# Patient Record
Sex: Male | Born: 1945 | Race: White | Hispanic: No | Smoking: Former smoker
Health system: Southern US, Community
[De-identification: ages and names within clinical notes are randomized; demographics above are authoritative.]

## PROBLEM LIST (undated history)

## (undated) DIAGNOSIS — I10 Essential (primary) hypertension: Secondary | ICD-10-CM

## (undated) DIAGNOSIS — E785 Hyperlipidemia, unspecified: Secondary | ICD-10-CM

## (undated) HISTORY — DX: Essential (primary) hypertension: I10

## (undated) HISTORY — DX: Hyperlipidemia, unspecified: E78.5

## (undated) HISTORY — PX: VASECTOMY: SHX75

---

## 2005-07-26 ENCOUNTER — Ambulatory Visit: Payer: Self-pay | Admitting: Internal Medicine

## 2010-04-11 ENCOUNTER — Ambulatory Visit: Payer: Self-pay | Admitting: Gastroenterology

## 2013-03-20 HISTORY — PX: OTHER SURGICAL HISTORY: SHX169

## 2013-10-18 HISTORY — PX: CATARACT EXTRACTION, BILATERAL: SHX1313

## 2013-10-22 ENCOUNTER — Ambulatory Visit: Payer: Self-pay | Admitting: Ophthalmology

## 2015-03-24 DIAGNOSIS — E782 Mixed hyperlipidemia: Secondary | ICD-10-CM | POA: Diagnosis not present

## 2015-03-24 DIAGNOSIS — Z0001 Encounter for general adult medical examination with abnormal findings: Secondary | ICD-10-CM | POA: Diagnosis not present

## 2015-03-24 DIAGNOSIS — Z125 Encounter for screening for malignant neoplasm of prostate: Secondary | ICD-10-CM | POA: Diagnosis not present

## 2015-04-01 DIAGNOSIS — Z23 Encounter for immunization: Secondary | ICD-10-CM | POA: Diagnosis not present

## 2015-04-01 DIAGNOSIS — E782 Mixed hyperlipidemia: Secondary | ICD-10-CM | POA: Diagnosis not present

## 2015-04-01 DIAGNOSIS — I10 Essential (primary) hypertension: Secondary | ICD-10-CM | POA: Diagnosis not present

## 2015-04-01 DIAGNOSIS — N501 Vascular disorders of male genital organs: Secondary | ICD-10-CM | POA: Diagnosis not present

## 2015-04-28 DIAGNOSIS — Z0120 Encounter for dental examination and cleaning without abnormal findings: Secondary | ICD-10-CM | POA: Diagnosis not present

## 2015-06-14 DIAGNOSIS — H2512 Age-related nuclear cataract, left eye: Secondary | ICD-10-CM | POA: Diagnosis not present

## 2015-09-30 DIAGNOSIS — Z125 Encounter for screening for malignant neoplasm of prostate: Secondary | ICD-10-CM | POA: Diagnosis not present

## 2015-09-30 DIAGNOSIS — Z0001 Encounter for general adult medical examination with abnormal findings: Secondary | ICD-10-CM | POA: Diagnosis not present

## 2015-09-30 DIAGNOSIS — N529 Male erectile dysfunction, unspecified: Secondary | ICD-10-CM | POA: Diagnosis not present

## 2015-09-30 DIAGNOSIS — I10 Essential (primary) hypertension: Secondary | ICD-10-CM | POA: Diagnosis not present

## 2015-09-30 DIAGNOSIS — E782 Mixed hyperlipidemia: Secondary | ICD-10-CM | POA: Diagnosis not present

## 2015-12-30 DIAGNOSIS — Z23 Encounter for immunization: Secondary | ICD-10-CM | POA: Diagnosis not present

## 2016-03-27 DIAGNOSIS — Z125 Encounter for screening for malignant neoplasm of prostate: Secondary | ICD-10-CM | POA: Diagnosis not present

## 2016-03-27 DIAGNOSIS — I10 Essential (primary) hypertension: Secondary | ICD-10-CM | POA: Diagnosis not present

## 2016-03-27 DIAGNOSIS — Z0001 Encounter for general adult medical examination with abnormal findings: Secondary | ICD-10-CM | POA: Diagnosis not present

## 2016-03-30 DIAGNOSIS — E782 Mixed hyperlipidemia: Secondary | ICD-10-CM | POA: Diagnosis not present

## 2016-03-30 DIAGNOSIS — J309 Allergic rhinitis, unspecified: Secondary | ICD-10-CM | POA: Diagnosis not present

## 2016-03-30 DIAGNOSIS — I10 Essential (primary) hypertension: Secondary | ICD-10-CM | POA: Diagnosis not present

## 2016-07-06 DIAGNOSIS — H2512 Age-related nuclear cataract, left eye: Secondary | ICD-10-CM | POA: Diagnosis not present

## 2016-10-05 DIAGNOSIS — E782 Mixed hyperlipidemia: Secondary | ICD-10-CM | POA: Diagnosis not present

## 2016-10-05 DIAGNOSIS — N529 Male erectile dysfunction, unspecified: Secondary | ICD-10-CM | POA: Diagnosis not present

## 2016-10-05 DIAGNOSIS — I10 Essential (primary) hypertension: Secondary | ICD-10-CM | POA: Diagnosis not present

## 2016-10-05 DIAGNOSIS — Z0001 Encounter for general adult medical examination with abnormal findings: Secondary | ICD-10-CM | POA: Diagnosis not present

## 2016-12-25 DIAGNOSIS — R69 Illness, unspecified: Secondary | ICD-10-CM | POA: Diagnosis not present

## 2016-12-28 ENCOUNTER — Other Ambulatory Visit: Payer: Self-pay | Admitting: Nurse Practitioner

## 2016-12-28 ENCOUNTER — Ambulatory Visit
Admission: RE | Admit: 2016-12-28 | Discharge: 2016-12-28 | Disposition: A | Discharge status: Home / Self Care | Payer: Medicare HMO | Source: Ambulatory Visit | Attending: Nurse Practitioner | Admitting: Nurse Practitioner

## 2016-12-28 DIAGNOSIS — M542 Cervicalgia: Secondary | ICD-10-CM | POA: Insufficient documentation

## 2016-12-28 DIAGNOSIS — R52 Pain, unspecified: Secondary | ICD-10-CM

## 2016-12-28 DIAGNOSIS — M5030 Other cervical disc degeneration, unspecified cervical region: Secondary | ICD-10-CM | POA: Insufficient documentation

## 2016-12-28 DIAGNOSIS — E782 Mixed hyperlipidemia: Secondary | ICD-10-CM | POA: Diagnosis not present

## 2016-12-28 DIAGNOSIS — I10 Essential (primary) hypertension: Secondary | ICD-10-CM | POA: Diagnosis not present

## 2016-12-28 DIAGNOSIS — M4802 Spinal stenosis, cervical region: Secondary | ICD-10-CM | POA: Diagnosis not present

## 2017-01-25 DIAGNOSIS — M542 Cervicalgia: Secondary | ICD-10-CM | POA: Diagnosis not present

## 2017-02-05 DIAGNOSIS — N390 Urinary tract infection, site not specified: Secondary | ICD-10-CM | POA: Diagnosis not present

## 2017-02-05 DIAGNOSIS — R300 Dysuria: Secondary | ICD-10-CM | POA: Diagnosis not present

## 2017-02-21 DIAGNOSIS — N390 Urinary tract infection, site not specified: Secondary | ICD-10-CM | POA: Diagnosis not present

## 2017-02-21 DIAGNOSIS — I10 Essential (primary) hypertension: Secondary | ICD-10-CM | POA: Diagnosis not present

## 2017-04-20 ENCOUNTER — Encounter: Payer: Self-pay | Admitting: Nurse Practitioner

## 2017-04-20 ENCOUNTER — Ambulatory Visit: Payer: Medicare HMO | Admitting: Nurse Practitioner

## 2017-04-20 VITALS — BP 138/80 | HR 54 | Resp 16 | Ht 72.0 in | Wt 173.0 lb

## 2017-04-20 DIAGNOSIS — I10 Essential (primary) hypertension: Secondary | ICD-10-CM | POA: Diagnosis not present

## 2017-04-20 DIAGNOSIS — E782 Mixed hyperlipidemia: Secondary | ICD-10-CM | POA: Diagnosis not present

## 2017-04-20 DIAGNOSIS — M542 Cervicalgia: Secondary | ICD-10-CM

## 2017-04-20 NOTE — Progress Notes (Addendum)
[facility] [address] [place], [place] [address]  Internal MEDICINE  Office Visit Note  Patient Name: Michael Everett  [id number]  [id number]  Date of Service: 04/29/2017  Chief Complaint  Patient presents with  . Neck Pain    The patient is here for routine follow up exam. Complaining of neck pain. Pain worse when bending his head in forward motion. Able to turn and bend head to right and left without difficulty. started celebrex in October. Has helped a ltttle. X-ray of the neck did show degenerative disc disease and bilateral neuroforaminal narrowing in multiple levels.    Neck Pain   This is a chronic problem. The current episode started more than 1 month ago. The problem occurs every several days. The problem has been unchanged. The pain is associated with nothing. The pain is present in the right side. The quality of the pain is described as aching. The pain is moderate. Exacerbated by: bending the head forward.  Stiffness is present all day. Pertinent negatives include no chest pain, headaches or numbness. He has tried neck support and NSAIDs for the symptoms. The treatment provided mild relief.    Pt is here for routine follow up.    Current Medication: Outpatient Encounter Medications as of 04/20/2017  Medication Sig  . aspirin EC 81 MG tablet Take 81 mg by mouth daily.  . sildenafil (REVATIO) 20 MG tablet Take 2-3 tabs po 1 hr prior  To desired sexual activity  . atorvastatin (LIPITOR) 10 MG tablet TAKE 1 TABLET BY MOUTH AT BEDTIME FOR CHOLESTEROL  . celecoxib (CELEBREX) 200 MG capsule TAKE 1 CAPSULE BY MOUTH EVERY DAY FOR INFLAMMATION FOR 10 DAYS THEN AS NEEDED  . fluticasone (FLONASE) 50 MCG/ACT nasal spray USE 1 TO 2 SPRAYS IN BOTH NOSTRILS DAILY.  Marland Kitchen lisinopril (PRINIVIL,ZESTRIL) 40 MG tablet TAKE 1 TABLET BY MOUTH EVERY DAY FOR BLOOD PRESSURE CONTROL   No facility-administered encounter medications on file as of 04/20/2017.     Surgical History: Past  Surgical History:  Procedure Laterality Date  . cataract and lens implant right eye  2015  . CATARACT EXTRACTION, BILATERAL Bilateral 10/2013  . VASECTOMY      Medical History: Past Medical History:  Diagnosis Date  . Hyperlipidemia   . Hypertension     Family History: Family History  Problem Relation Age of Onset  . Hyperlipidemia Mother   . Heart disease Father     Social History   Socioeconomic History  . Marital status: Married    Spouse name: Not on file  . Number of children: Not on file  . Years of education: Not on file  . Highest education level: Not on file  Social Needs  . Financial resource strain: Not on file  . Food insecurity - worry: Not on file  . Food insecurity - inability: Not on file  . Transportation needs - medical: Not on file  . Transportation needs - non-medical: Not on file  Occupational History  . Not on file  Tobacco Use  . Smoking status: Never Smoker  . Smokeless tobacco: Never Used  Substance and Sexual Activity  . Alcohol use: No    Frequency: Never  . Drug use: No  . Sexual activity: Not on file  Other Topics Concern  . Not on file  Social History Narrative  . Not on file      Review of Systems  Constitutional: Negative for activity change, chills, fatigue and unexpected weight change.  HENT: Negative for congestion, postnasal drip, rhinorrhea, sneezing and sore throat.   Eyes: Negative for pain, discharge, redness, itching and visual disturbance.  Respiratory: Negative for cough, chest tightness, shortness of breath and wheezing.   Cardiovascular: Negative for chest pain and palpitations.  Gastrointestinal: Negative for abdominal pain, constipation, diarrhea, nausea and vomiting.  Genitourinary: Negative for dysuria and frequency.  Musculoskeletal: Positive for neck pain and neck stiffness. Negative for arthralgias, back pain and joint swelling.  Skin: Negative for rash.  Allergic/Immunologic: Negative for environmental  allergies, food allergies and immunocompromised state.  Neurological: Negative.  Negative for tremors, numbness and headaches.  Hematological: Negative for adenopathy. Does not bruise/bleed easily.  Psychiatric/Behavioral: Negative for sleep disturbance and suicidal ideas. Behavioral problem: Depression.    Today's Vitals   04/20/17 0903  BP: 138/80  Pulse: (!) 54  Resp: 16  SpO2: 98%  Weight: 173 lb (78.5 kg)  Height: 6' (1.829 m)    Physical Exam  Constitutional: He is oriented to person, place, and time. He appears well-developed and well-nourished. No distress.  HENT:  Head: Normocephalic and atraumatic.  Mouth/Throat: Oropharynx is clear and moist. No oropharyngeal exudate.  Eyes: EOM are normal. Pupils are equal, round, and reactive to light.  Neck: Neck supple. No JVD present. Spinous process tenderness and muscular tenderness present. Carotid bruit is not present. No tracheal deviation present. No thyromegaly present.    Cardiovascular: Normal rate, regular rhythm and normal heart sounds. Exam reveals no gallop and no friction rub.  No murmur heard. Pulmonary/Chest: Effort normal and breath sounds normal. No respiratory distress. He has no wheezes. He has no rales. He exhibits no tenderness.  Abdominal: Soft. Bowel sounds are normal. There is no tenderness.  Musculoskeletal: Normal range of motion.  Lymphadenopathy:    He has no cervical adenopathy.  Neurological: He is alert and oriented to person, place, and time. No cranial nerve deficit.  Skin: Skin is warm and dry. He is not diaphoretic.  Psychiatric: He has a normal mood and affect. His behavior is normal. Judgment and thought content normal.  Nursing note and vitals reviewed.   Assessment/Plan: 1. Cervicalgia Continue celebrex daily. Recommended physical therapy and written order given. If no improvement after PT, will get MRI.   2. Essential hypertension Stable. Continue bp medication as prescribed   3.  Mixed hyperlipidemia Cholesterol panel stable. contineu atorvastatin as prescribed   General Counseling: Frederick Peersrnest verbalizes understanding of the findings of todays visit and agrees with plan of treatment. I have discussed any further diagnostic evaluation that may be needed or ordered today. We also reviewed his medications today. he has been encouraged to call the office with any questions or concerns that should arise related to todays visit.  This patient was seen by Vincent GrosHeather Onetta Spainhower, FNP- C in Collaboration with Dr Lyndon CodeFozia M Khan as a part of collaborative care agreement   Time spent: 15 Minutes     Dr Lyndon CodeFozia M Khan Internal medicine

## 2017-04-29 ENCOUNTER — Encounter: Payer: Self-pay | Admitting: Nurse Practitioner

## 2017-04-29 DIAGNOSIS — M542 Cervicalgia: Secondary | ICD-10-CM | POA: Insufficient documentation

## 2017-04-29 DIAGNOSIS — E785 Hyperlipidemia, unspecified: Secondary | ICD-10-CM | POA: Insufficient documentation

## 2017-04-29 DIAGNOSIS — I10 Essential (primary) hypertension: Secondary | ICD-10-CM | POA: Insufficient documentation

## 2017-05-02 DIAGNOSIS — M546 Pain in thoracic spine: Secondary | ICD-10-CM | POA: Diagnosis not present

## 2017-05-02 DIAGNOSIS — M542 Cervicalgia: Secondary | ICD-10-CM | POA: Diagnosis not present

## 2017-05-02 DIAGNOSIS — M25511 Pain in right shoulder: Secondary | ICD-10-CM | POA: Diagnosis not present

## 2017-05-02 DIAGNOSIS — M6281 Muscle weakness (generalized): Secondary | ICD-10-CM | POA: Diagnosis not present

## 2017-05-07 DIAGNOSIS — M546 Pain in thoracic spine: Secondary | ICD-10-CM | POA: Diagnosis not present

## 2017-05-07 DIAGNOSIS — M542 Cervicalgia: Secondary | ICD-10-CM | POA: Diagnosis not present

## 2017-05-07 DIAGNOSIS — M6281 Muscle weakness (generalized): Secondary | ICD-10-CM | POA: Diagnosis not present

## 2017-05-07 DIAGNOSIS — M25511 Pain in right shoulder: Secondary | ICD-10-CM | POA: Diagnosis not present

## 2017-05-09 DIAGNOSIS — M546 Pain in thoracic spine: Secondary | ICD-10-CM | POA: Diagnosis not present

## 2017-05-09 DIAGNOSIS — M6281 Muscle weakness (generalized): Secondary | ICD-10-CM | POA: Diagnosis not present

## 2017-05-09 DIAGNOSIS — M542 Cervicalgia: Secondary | ICD-10-CM | POA: Diagnosis not present

## 2017-05-09 DIAGNOSIS — M25511 Pain in right shoulder: Secondary | ICD-10-CM | POA: Diagnosis not present

## 2017-05-14 DIAGNOSIS — M542 Cervicalgia: Secondary | ICD-10-CM | POA: Diagnosis not present

## 2017-05-14 DIAGNOSIS — M6281 Muscle weakness (generalized): Secondary | ICD-10-CM | POA: Diagnosis not present

## 2017-05-14 DIAGNOSIS — M546 Pain in thoracic spine: Secondary | ICD-10-CM | POA: Diagnosis not present

## 2017-05-14 DIAGNOSIS — M25511 Pain in right shoulder: Secondary | ICD-10-CM | POA: Diagnosis not present

## 2017-05-16 DIAGNOSIS — M6281 Muscle weakness (generalized): Secondary | ICD-10-CM | POA: Diagnosis not present

## 2017-05-16 DIAGNOSIS — M25511 Pain in right shoulder: Secondary | ICD-10-CM | POA: Diagnosis not present

## 2017-05-16 DIAGNOSIS — M542 Cervicalgia: Secondary | ICD-10-CM | POA: Diagnosis not present

## 2017-05-16 DIAGNOSIS — M546 Pain in thoracic spine: Secondary | ICD-10-CM | POA: Diagnosis not present

## 2017-05-21 DIAGNOSIS — M6281 Muscle weakness (generalized): Secondary | ICD-10-CM | POA: Diagnosis not present

## 2017-05-21 DIAGNOSIS — M25511 Pain in right shoulder: Secondary | ICD-10-CM | POA: Diagnosis not present

## 2017-05-21 DIAGNOSIS — M542 Cervicalgia: Secondary | ICD-10-CM | POA: Diagnosis not present

## 2017-05-21 DIAGNOSIS — M546 Pain in thoracic spine: Secondary | ICD-10-CM | POA: Diagnosis not present

## 2017-05-23 DIAGNOSIS — M25511 Pain in right shoulder: Secondary | ICD-10-CM | POA: Diagnosis not present

## 2017-05-23 DIAGNOSIS — M6281 Muscle weakness (generalized): Secondary | ICD-10-CM | POA: Diagnosis not present

## 2017-05-23 DIAGNOSIS — M542 Cervicalgia: Secondary | ICD-10-CM | POA: Diagnosis not present

## 2017-05-23 DIAGNOSIS — M546 Pain in thoracic spine: Secondary | ICD-10-CM | POA: Diagnosis not present

## 2017-05-24 ENCOUNTER — Other Ambulatory Visit: Payer: Self-pay

## 2017-05-24 MED ORDER — FLUTICASONE PROPIONATE 50 MCG/ACT NA SUSP: 1.0000 | Freq: Every day | NASAL | 3 refills | Status: DC

## 2017-05-28 DIAGNOSIS — M546 Pain in thoracic spine: Secondary | ICD-10-CM | POA: Diagnosis not present

## 2017-05-28 DIAGNOSIS — M6281 Muscle weakness (generalized): Secondary | ICD-10-CM | POA: Diagnosis not present

## 2017-05-28 DIAGNOSIS — M542 Cervicalgia: Secondary | ICD-10-CM | POA: Diagnosis not present

## 2017-05-28 DIAGNOSIS — M25511 Pain in right shoulder: Secondary | ICD-10-CM | POA: Diagnosis not present

## 2017-05-31 DIAGNOSIS — M542 Cervicalgia: Secondary | ICD-10-CM | POA: Diagnosis not present

## 2017-05-31 DIAGNOSIS — M25511 Pain in right shoulder: Secondary | ICD-10-CM | POA: Diagnosis not present

## 2017-05-31 DIAGNOSIS — M6281 Muscle weakness (generalized): Secondary | ICD-10-CM | POA: Diagnosis not present

## 2017-05-31 DIAGNOSIS — M546 Pain in thoracic spine: Secondary | ICD-10-CM | POA: Diagnosis not present

## 2017-06-04 DIAGNOSIS — M542 Cervicalgia: Secondary | ICD-10-CM | POA: Diagnosis not present

## 2017-06-04 DIAGNOSIS — M25511 Pain in right shoulder: Secondary | ICD-10-CM | POA: Diagnosis not present

## 2017-06-04 DIAGNOSIS — M546 Pain in thoracic spine: Secondary | ICD-10-CM | POA: Diagnosis not present

## 2017-06-04 DIAGNOSIS — M6281 Muscle weakness (generalized): Secondary | ICD-10-CM | POA: Diagnosis not present

## 2017-06-06 ENCOUNTER — Other Ambulatory Visit: Payer: Self-pay

## 2017-06-06 MED ORDER — CELECOXIB 200 MG PO CAPS: ORAL_CAPSULE | ORAL | 3 refills | Status: DC

## 2017-06-07 ENCOUNTER — Ambulatory Visit: Payer: Self-pay | Admitting: Nurse Practitioner

## 2017-06-07 DIAGNOSIS — M542 Cervicalgia: Secondary | ICD-10-CM | POA: Diagnosis not present

## 2017-06-07 DIAGNOSIS — M6281 Muscle weakness (generalized): Secondary | ICD-10-CM | POA: Diagnosis not present

## 2017-06-07 DIAGNOSIS — M546 Pain in thoracic spine: Secondary | ICD-10-CM | POA: Diagnosis not present

## 2017-06-07 DIAGNOSIS — M25511 Pain in right shoulder: Secondary | ICD-10-CM | POA: Diagnosis not present

## 2017-06-11 DIAGNOSIS — M6281 Muscle weakness (generalized): Secondary | ICD-10-CM | POA: Diagnosis not present

## 2017-06-11 DIAGNOSIS — M546 Pain in thoracic spine: Secondary | ICD-10-CM | POA: Diagnosis not present

## 2017-06-11 DIAGNOSIS — M542 Cervicalgia: Secondary | ICD-10-CM | POA: Diagnosis not present

## 2017-06-11 DIAGNOSIS — M25511 Pain in right shoulder: Secondary | ICD-10-CM | POA: Diagnosis not present

## 2017-06-14 DIAGNOSIS — M546 Pain in thoracic spine: Secondary | ICD-10-CM | POA: Diagnosis not present

## 2017-06-14 DIAGNOSIS — M25511 Pain in right shoulder: Secondary | ICD-10-CM | POA: Diagnosis not present

## 2017-06-14 DIAGNOSIS — M6281 Muscle weakness (generalized): Secondary | ICD-10-CM | POA: Diagnosis not present

## 2017-06-14 DIAGNOSIS — M542 Cervicalgia: Secondary | ICD-10-CM | POA: Diagnosis not present

## 2017-06-18 DIAGNOSIS — M6281 Muscle weakness (generalized): Secondary | ICD-10-CM | POA: Diagnosis not present

## 2017-06-18 DIAGNOSIS — M546 Pain in thoracic spine: Secondary | ICD-10-CM | POA: Diagnosis not present

## 2017-06-18 DIAGNOSIS — M542 Cervicalgia: Secondary | ICD-10-CM | POA: Diagnosis not present

## 2017-06-18 DIAGNOSIS — M25511 Pain in right shoulder: Secondary | ICD-10-CM | POA: Diagnosis not present

## 2017-06-21 DIAGNOSIS — M546 Pain in thoracic spine: Secondary | ICD-10-CM | POA: Diagnosis not present

## 2017-06-21 DIAGNOSIS — M542 Cervicalgia: Secondary | ICD-10-CM | POA: Diagnosis not present

## 2017-06-21 DIAGNOSIS — M25511 Pain in right shoulder: Secondary | ICD-10-CM | POA: Diagnosis not present

## 2017-06-21 DIAGNOSIS — M6281 Muscle weakness (generalized): Secondary | ICD-10-CM | POA: Diagnosis not present

## 2017-06-26 DIAGNOSIS — M25511 Pain in right shoulder: Secondary | ICD-10-CM | POA: Diagnosis not present

## 2017-06-26 DIAGNOSIS — M542 Cervicalgia: Secondary | ICD-10-CM | POA: Diagnosis not present

## 2017-06-26 DIAGNOSIS — M546 Pain in thoracic spine: Secondary | ICD-10-CM | POA: Diagnosis not present

## 2017-06-26 DIAGNOSIS — M6281 Muscle weakness (generalized): Secondary | ICD-10-CM | POA: Diagnosis not present

## 2017-06-28 ENCOUNTER — Encounter: Payer: Self-pay | Admitting: Nurse Practitioner

## 2017-06-28 ENCOUNTER — Ambulatory Visit: Payer: Medicare HMO | Admitting: Nurse Practitioner

## 2017-06-28 VITALS — BP 132/82 | HR 67 | Resp 16 | Ht 72.0 in | Wt 170.8 lb

## 2017-06-28 DIAGNOSIS — E782 Mixed hyperlipidemia: Secondary | ICD-10-CM | POA: Diagnosis not present

## 2017-06-28 DIAGNOSIS — I10 Essential (primary) hypertension: Secondary | ICD-10-CM | POA: Diagnosis not present

## 2017-06-28 DIAGNOSIS — M546 Pain in thoracic spine: Secondary | ICD-10-CM | POA: Diagnosis not present

## 2017-06-28 DIAGNOSIS — M542 Cervicalgia: Secondary | ICD-10-CM | POA: Diagnosis not present

## 2017-06-28 DIAGNOSIS — M25511 Pain in right shoulder: Secondary | ICD-10-CM | POA: Diagnosis not present

## 2017-06-28 DIAGNOSIS — M6281 Muscle weakness (generalized): Secondary | ICD-10-CM | POA: Diagnosis not present

## 2017-06-28 MED ORDER — ETODOLAC 400 MG PO TABS: 400.0000 mg | ORAL_TABLET | Freq: Two times a day (BID) | ORAL | 3 refills | Status: DC | PRN

## 2017-06-28 NOTE — Progress Notes (Signed)
[facility] [address] [place], [place] [address]  Internal MEDICINE  Office Visit Note  Patient Name: Michael Everett  [id number]  [id number]  Date of Service: 06/28/2017  Chief Complaint  Patient presents with  . Follow-up    The patient is here for routine follow up exam. Complaining of neck pain. Improved since his last visit. started physical therapy late in February. Is scheduled to continue this through May. He does feel like pain is improving. It is not present at all times. Now just hurting at the extent of movement toward the right.  Pain worse when bending his head in forward motion. Able to turn and bend head to right and left without difficulty. started celebrex in October. No longer taking celebrex. Did not see that it was helping that much    Pt is here for routine follow up.    Current Medication: Outpatient Encounter Medications as of 06/28/2017  Medication Sig  . aspirin EC 81 MG tablet Take 81 mg by mouth daily.  Marland Kitchen atorvastatin (LIPITOR) 10 MG tablet TAKE 1 TABLET BY MOUTH AT BEDTIME FOR CHOLESTEROL  . fluticasone (FLONASE) 50 MCG/ACT nasal spray Place 1 spray into both nostrils daily.  Marland Kitchen lisinopril (PRINIVIL,ZESTRIL) 40 MG tablet TAKE 1 TABLET BY MOUTH EVERY DAY FOR BLOOD PRESSURE CONTROL  . sildenafil (REVATIO) 20 MG tablet Take 2-3 tabs po 1 hr prior  To desired sexual activity  . etodolac (LODINE) 400 MG tablet Take 1 tablet (400 mg total) by mouth 2 (two) times daily as needed for moderate pain.  . [DISCONTINUED] celecoxib (CELEBREX) 200 MG capsule TAKE 1 CAPSULE BY MOUTH EVERY DAY FOR INFLAMMATION FOR 10 DAYS THEN AS NEEDED   No facility-administered encounter medications on file as of 06/28/2017.     Surgical History: Past Surgical History:  Procedure Laterality Date  . cataract and lens implant right eye  2015  . CATARACT EXTRACTION, BILATERAL Bilateral 10/2013  . VASECTOMY      Medical History: Past Medical History:  Diagnosis  Date  . Hyperlipidemia   . Hypertension     Family History: Family History  Problem Relation Age of Onset  . Hyperlipidemia Mother   . Heart disease Father     Social History   Socioeconomic History  . Marital status: Married    Spouse name: Not on file  . Number of children: Not on file  . Years of education: Not on file  . Highest education level: Not on file  Occupational History  . Not on file  Social Needs  . Financial resource strain: Not on file  . Food insecurity:    Worry: Not on file    Inability: Not on file  . Transportation needs:    Medical: Not on file    Non-medical: Not on file  Tobacco Use  . Smoking status: Never Smoker  . Smokeless tobacco: Never Used  Substance and Sexual Activity  . Alcohol use: No    Frequency: Never  . Drug use: No  . Sexual activity: Not on file  Lifestyle  . Physical activity:    Days per week: Not on file    Minutes per session: Not on file  . Stress: Not on file  Relationships  . Social connections:    Talks on phone: Not on file    Gets together: Not on file    Attends religious service: Not on file    Active member of club or organization: Not on file  Attends meetings of clubs or organizations: Not on file    Relationship status: Not on file  . Intimate partner violence:    Fear of current or ex partner: Not on file    Emotionally abused: Not on file    Physically abused: Not on file    Forced sexual activity: Not on file  Other Topics Concern  . Not on file  Social History Narrative  . Not on file      Review of Systems  Constitutional: Negative for activity change, chills, fatigue and unexpected weight change.  HENT: Negative for congestion, postnasal drip, rhinorrhea, sneezing and sore throat.   Eyes: Negative.  Negative for pain, discharge, redness, itching and visual disturbance.  Respiratory: Negative for cough, chest tightness, shortness of breath and wheezing.   Cardiovascular: Negative for  chest pain and palpitations.  Gastrointestinal: Negative for abdominal pain, constipation, diarrhea, nausea and vomiting.  Endocrine: Negative for cold intolerance, heat intolerance, polydipsia, polyphagia and polyuria.  Genitourinary: Negative for dysuria and frequency.  Musculoskeletal: Positive for neck pain and neck stiffness. Negative for arthralgias, back pain and joint swelling.       Improving with improved ROM in neck.   Skin: Negative for rash.  Allergic/Immunologic: Negative for environmental allergies, food allergies and immunocompromised state.  Neurological: Negative.  Negative for tremors, numbness and headaches.  Hematological: Negative for adenopathy. Does not bruise/bleed easily.  Psychiatric/Behavioral: Negative for sleep disturbance and suicidal ideas. Behavioral problem: Depression.   Today's Vitals   06/28/17 1424  BP: 132/82  Pulse: 67  Resp: 16  SpO2: 97%  Weight: 170 lb 12.8 oz (77.5 kg)  Height: 6' (1.829 m)    Physical Exam  Constitutional: He is oriented to person, place, and time. He appears well-developed and well-nourished. No distress.  HENT:  Head: Normocephalic and atraumatic.  Mouth/Throat: Oropharynx is clear and moist. No oropharyngeal exudate.  Eyes: Pupils are equal, round, and reactive to light. EOM are normal.  Neck: Neck supple. No JVD present. Spinous process tenderness and muscular tenderness present. Carotid bruit is not present. No tracheal deviation present. No thyromegaly present.    Cardiovascular: Normal rate, regular rhythm and normal heart sounds. Exam reveals no gallop and no friction rub.  No murmur heard. Pulmonary/Chest: Effort normal and breath sounds normal. No respiratory distress. He has no wheezes. He has no rales. He exhibits no tenderness.  Abdominal: Soft. Bowel sounds are normal. There is no tenderness.  Musculoskeletal: Normal range of motion.  Lymphadenopathy:    He has no cervical adenopathy.  Neurological: He  is alert and oriented to person, place, and time. No cranial nerve deficit.  Skin: Skin is warm and dry. He is not diaphoretic.  Psychiatric: He has a normal mood and affect. His behavior is normal. Judgment and thought content normal.  Nursing note and vitals reviewed.  Assessment/Plan: 1. Cervicalgia - etodolac (LODINE) 400 MG tablet; Take 1 tablet (400 mg total) by mouth 2 (two) times daily as needed for moderate pain.  Dispense: 60 tablet; Refill: 3 Patient will continue physical therapy. Sessions scheduled through Jul 26, 2017. Will get MRI if neck pain is persistent after completion of PT.   2. Essential hypertension Stable. Continue bp medication as prescribed.  3. Mixed hyperlipidemia Continue atorvastatin as prescribed.   General Counseling: Michael Peersrnest verbalizes understanding of the findings of todays visit and agrees with plan of treatment. I have discussed any further diagnostic evaluation that may be needed or ordered today. We also reviewed his  medications today. he has been encouraged to call the office with any questions or concerns that should arise related to todays visit.   This patient was seen by Vincent Gros, FNP- C in Collaboration with Dr Lyndon Code as a part of collaborative care agreement  Meds ordered this encounter  Medications  . etodolac (LODINE) 400 MG tablet    Sig: Take 1 tablet (400 mg total) by mouth 2 (two) times daily as needed for moderate pain.    Dispense:  60 tablet    Refill:  3    Order Specific Question:   Supervising Provider    Answer:   Lyndon Code [[id number]]    Time spent: 1 Minutes     Dr Lyndon Code Internal medicine

## 2017-07-03 DIAGNOSIS — M6281 Muscle weakness (generalized): Secondary | ICD-10-CM | POA: Diagnosis not present

## 2017-07-03 DIAGNOSIS — M542 Cervicalgia: Secondary | ICD-10-CM | POA: Diagnosis not present

## 2017-07-03 DIAGNOSIS — M546 Pain in thoracic spine: Secondary | ICD-10-CM | POA: Diagnosis not present

## 2017-07-03 DIAGNOSIS — M25511 Pain in right shoulder: Secondary | ICD-10-CM | POA: Diagnosis not present

## 2017-07-06 DIAGNOSIS — M546 Pain in thoracic spine: Secondary | ICD-10-CM | POA: Diagnosis not present

## 2017-07-06 DIAGNOSIS — M6281 Muscle weakness (generalized): Secondary | ICD-10-CM | POA: Diagnosis not present

## 2017-07-06 DIAGNOSIS — M25511 Pain in right shoulder: Secondary | ICD-10-CM | POA: Diagnosis not present

## 2017-07-06 DIAGNOSIS — M542 Cervicalgia: Secondary | ICD-10-CM | POA: Diagnosis not present

## 2017-07-10 DIAGNOSIS — M25511 Pain in right shoulder: Secondary | ICD-10-CM | POA: Diagnosis not present

## 2017-07-10 DIAGNOSIS — M6281 Muscle weakness (generalized): Secondary | ICD-10-CM | POA: Diagnosis not present

## 2017-07-10 DIAGNOSIS — M542 Cervicalgia: Secondary | ICD-10-CM | POA: Diagnosis not present

## 2017-07-10 DIAGNOSIS — M546 Pain in thoracic spine: Secondary | ICD-10-CM | POA: Diagnosis not present

## 2017-07-12 DIAGNOSIS — M6281 Muscle weakness (generalized): Secondary | ICD-10-CM | POA: Diagnosis not present

## 2017-07-12 DIAGNOSIS — M546 Pain in thoracic spine: Secondary | ICD-10-CM | POA: Diagnosis not present

## 2017-07-12 DIAGNOSIS — M25511 Pain in right shoulder: Secondary | ICD-10-CM | POA: Diagnosis not present

## 2017-07-12 DIAGNOSIS — M542 Cervicalgia: Secondary | ICD-10-CM | POA: Diagnosis not present

## 2017-07-19 DIAGNOSIS — M6281 Muscle weakness (generalized): Secondary | ICD-10-CM | POA: Diagnosis not present

## 2017-07-19 DIAGNOSIS — M546 Pain in thoracic spine: Secondary | ICD-10-CM | POA: Diagnosis not present

## 2017-07-19 DIAGNOSIS — M542 Cervicalgia: Secondary | ICD-10-CM | POA: Diagnosis not present

## 2017-07-19 DIAGNOSIS — M25511 Pain in right shoulder: Secondary | ICD-10-CM | POA: Diagnosis not present

## 2017-07-26 ENCOUNTER — Telehealth: Payer: Self-pay | Admitting: Internal Medicine

## 2017-07-26 DIAGNOSIS — M542 Cervicalgia: Secondary | ICD-10-CM | POA: Diagnosis not present

## 2017-07-26 DIAGNOSIS — M6281 Muscle weakness (generalized): Secondary | ICD-10-CM | POA: Diagnosis not present

## 2017-07-26 DIAGNOSIS — M546 Pain in thoracic spine: Secondary | ICD-10-CM | POA: Diagnosis not present

## 2017-07-26 DIAGNOSIS — M25511 Pain in right shoulder: Secondary | ICD-10-CM | POA: Diagnosis not present

## 2017-07-26 NOTE — Telephone Encounter (Signed)
Mailed PIVOT pt orders back to dept after dfk signed. Beth

## 2017-08-01 DIAGNOSIS — H35371 Puckering of macula, right eye: Secondary | ICD-10-CM | POA: Diagnosis not present

## 2017-08-08 DIAGNOSIS — M542 Cervicalgia: Secondary | ICD-10-CM | POA: Diagnosis not present

## 2017-08-08 DIAGNOSIS — M546 Pain in thoracic spine: Secondary | ICD-10-CM | POA: Diagnosis not present

## 2017-08-08 DIAGNOSIS — M25511 Pain in right shoulder: Secondary | ICD-10-CM | POA: Diagnosis not present

## 2017-08-08 DIAGNOSIS — M6281 Muscle weakness (generalized): Secondary | ICD-10-CM | POA: Diagnosis not present

## 2017-09-17 ENCOUNTER — Other Ambulatory Visit: Payer: Self-pay | Admitting: Nurse Practitioner

## 2017-09-17 ENCOUNTER — Ambulatory Visit: Payer: Medicare HMO | Admitting: Nurse Practitioner

## 2017-09-17 ENCOUNTER — Encounter: Payer: Self-pay | Admitting: Nurse Practitioner

## 2017-09-17 VITALS — BP 155/90 | HR 56 | Resp 16 | Ht 72.0 in | Wt 167.4 lb

## 2017-09-17 DIAGNOSIS — M542 Cervicalgia: Secondary | ICD-10-CM | POA: Diagnosis not present

## 2017-09-17 DIAGNOSIS — E782 Mixed hyperlipidemia: Secondary | ICD-10-CM

## 2017-09-17 DIAGNOSIS — Z125 Encounter for screening for malignant neoplasm of prostate: Secondary | ICD-10-CM | POA: Diagnosis not present

## 2017-09-17 DIAGNOSIS — Z23 Encounter for immunization: Secondary | ICD-10-CM

## 2017-09-17 DIAGNOSIS — R300 Dysuria: Secondary | ICD-10-CM

## 2017-09-17 DIAGNOSIS — Z0001 Encounter for general adult medical examination with abnormal findings: Secondary | ICD-10-CM | POA: Diagnosis not present

## 2017-09-17 DIAGNOSIS — I10 Essential (primary) hypertension: Secondary | ICD-10-CM

## 2017-09-17 NOTE — Progress Notes (Signed)
[facility] [address] [place], [place] [address]  Internal MEDICINE  Office Visit Note  Patient Name: Michael Everett  [id number]  [id number]  Date of Service: 09/17/2017   Pt is here for routine health maintenance examination   Chief Complaint  Patient presents with  . Annual Exam  . Neck Pain    mostly along right side of neck.      Continues  To c/o neck pain, mostly along the right side of the neck. Improved since his last visit. Completed 2 months of physical therapy.  He does feel like pain has improved some. Pain doesn't occur until he rotates head to the right side, but does happen every time he moves or turns his head to the right. Does not really hurt when turning his head to the left. X-ray of the cervical spine did show Multilevel bilateral neural foraminal encroachment likely impacting the cervical nerve roots.    Current Medication: Outpatient Encounter Medications as of 09/17/2017  Medication Sig  . aspirin EC 81 MG tablet Take 81 mg by mouth daily.  Marland Kitchen atorvastatin (LIPITOR) 10 MG tablet TAKE 1 TABLET BY MOUTH AT BEDTIME FOR CHOLESTEROL  . etodolac (LODINE) 400 MG tablet Take 1 tablet (400 mg total) by mouth 2 (two) times daily as needed for moderate pain.  . fluticasone (FLONASE) 50 MCG/ACT nasal spray Place 1 spray into both nostrils daily.  Marland Kitchen lisinopril (PRINIVIL,ZESTRIL) 40 MG tablet TAKE 1 TABLET BY MOUTH EVERY DAY FOR BLOOD PRESSURE CONTROL  . sildenafil (REVATIO) 20 MG tablet Take 2-3 tabs po 1 hr prior  To desired sexual activity   No facility-administered encounter medications on file as of 09/17/2017.     Surgical History: Past Surgical History:  Procedure Laterality Date  . cataract and lens implant right eye  2015  . CATARACT EXTRACTION, BILATERAL Bilateral 10/2013  . VASECTOMY      Medical History: Past Medical History:  Diagnosis Date  . Hyperlipidemia   . Hypertension     Family History: Family History  Problem  Relation Age of Onset  . Hyperlipidemia Mother   . Heart disease Father       Review of Systems  Constitutional: Negative for activity change, chills, fatigue and unexpected weight change.  HENT: Negative for congestion, postnasal drip, rhinorrhea, sneezing and sore throat.   Eyes: Negative.  Negative for pain, discharge, redness, itching and visual disturbance.  Respiratory: Negative for cough, chest tightness, shortness of breath and wheezing.   Cardiovascular: Negative for chest pain and palpitations.  Gastrointestinal: Negative for abdominal pain, constipation, diarrhea, nausea and vomiting.  Endocrine: Negative for cold intolerance, heat intolerance, polydipsia, polyphagia and polyuria.  Genitourinary: Negative for dysuria and frequency.  Musculoskeletal: Positive for neck pain and neck stiffness. Negative for arthralgias, back pain and joint swelling.       Persistent pain in right side of neck. Completed 2 to 3 months physical therapy and continues to have pain.   Skin: Negative for rash.  Allergic/Immunologic: Negative for environmental allergies, food allergies and immunocompromised state.  Neurological: Negative for dizziness, tremors, numbness and headaches.  Hematological: Negative for adenopathy. Does not bruise/bleed easily.  Psychiatric/Behavioral: Negative for sleep disturbance and suicidal ideas. Behavioral problem: Depression.    Today's Vitals   09/17/17 1045  BP: (!) 155/90  Pulse: (!) 56  Resp: 16  SpO2: 98%  Weight: 167 lb 6.4 oz (75.9 kg)  Height: 6' (1.829 m)     Physical Exam  Constitutional: He  is oriented to person, place, and time. He appears well-developed and well-nourished. No distress.  HENT:  Head: Normocephalic and atraumatic.  Mouth/Throat: Oropharynx is clear and moist. No oropharyngeal exudate.  Eyes: Pupils are equal, round, and reactive to light. EOM are normal.  Neck: Neck supple. No JVD present. Spinous process tenderness and muscular  tenderness present. Carotid bruit is not present. No tracheal deviation present. No thyromegaly present.    Cardiovascular: Normal rate, regular rhythm, normal heart sounds and intact distal pulses. Exam reveals no gallop and no friction rub.  No murmur heard. Pulmonary/Chest: Effort normal and breath sounds normal. No respiratory distress. He has no wheezes. He has no rales. He exhibits no tenderness.  Abdominal: Soft. Bowel sounds are normal. There is no tenderness.  Musculoskeletal: Normal range of motion.  Lymphadenopathy:    He has no cervical adenopathy.  Neurological: He is alert and oriented to person, place, and time. No cranial nerve deficit.  Skin: Skin is warm and dry. He is not diaphoretic.  Psychiatric: He has a normal mood and affect. His behavior is normal. Judgment and thought content normal.  Nursing note and vitals reviewed.  Assessment/Plan: 1. Encounter for general adult medical examination with abnormal findings Annual health maintenance exam today. Routine, fasting labs ordered.  - CBC with Differential/Platelet - Comprehensive metabolic panel - Lipid panel - TSH  2. Cervicalgia Unresolved after conservative treatmetn with NSAIDs and physical therapy. Refer to ortho/spine specialist for further evaluation and treatmetn.  - Ambulatory referral to Orthopedic Surgery  3. Essential hypertension Stable. Continue bp medication as prescribed  - CBC with Differential/Platelet - Comprehensive metabolic panel  4. Mixed hyperlipidemia Routine, fasting labs ordered. Adjust statin therapy as indicated.  - Lipid panel  5. Special screening for malignant neoplasm of prostate - PSA  6. Need for vaccination against Streptococcus pneumoniae using pneumococcal conjugate vaccine 13 - Pneumococcal conjugate vaccine 13-valent IM  7. Dysuria - UA/M w/rflx Culture, Routine  General Counseling: Michael Everett verbalizes understanding of the findings of todays visit and agrees with  plan of treatment. I have discussed any further diagnostic evaluation that may be needed or ordered today. We also reviewed his medications today. he has been encouraged to call the office with any questions or concerns that should arise related to todays visit.    Counseling:  This patient was seen by Vincent GrosHeather Karmela Bram, FNP- C in Collaboration with Dr Lyndon CodeFozia M Khan as a part of collaborative care agreement  Orders Placed This Encounter  Procedures  . Pneumococcal conjugate vaccine 13-valent IM  . UA/M w/rflx Culture, Routine  . PSA  . CBC with Differential/Platelet  . Comprehensive metabolic panel  . Lipid panel  . TSH  . Ambulatory referral to Orthopedic Surgery     Time spent: 2930 Minutes   Lyndon CodeFozia M Khan, MD  Internal Medicine

## 2017-09-18 LAB — SPECIMEN STATUS REPORT

## 2017-09-18 LAB — COMPREHENSIVE METABOLIC PANEL
ALK PHOS: 75 IU/L (ref 39–117)
ALT: 22 IU/L (ref 0–44)
AST: 18 IU/L (ref 0–40)
Albumin/Globulin Ratio: 1.7 (ref 1.2–2.2)
Albumin: 4.3 g/dL (ref 3.5–4.8)
BUN/Creatinine Ratio: 19 (ref 10–24)
BUN: 18 mg/dL (ref 8–27)
Bilirubin Total: 0.9 mg/dL (ref 0.0–1.2)
CHLORIDE: 103 mmol/L (ref 96–106)
CO2: 24 mmol/L (ref 20–29)
CREATININE: 0.96 mg/dL (ref 0.76–1.27)
Calcium: 9.4 mg/dL (ref 8.6–10.2)
GFR calc Af Amer: 90 mL/min/{1.73_m2} (ref >59)
GFR calc non Af Amer: 78 mL/min/{1.73_m2} (ref >59)
GLOBULIN, TOTAL: 2.6 g/dL (ref 1.5–4.5)
GLUCOSE: 96 mg/dL (ref 65–99)
POTASSIUM: 5 mmol/L (ref 3.5–5.2)
SODIUM: 142 mmol/L (ref 134–144)
Total Protein: 6.9 g/dL (ref 6.0–8.5)

## 2017-09-18 LAB — LIPID PANEL W/O CHOL/HDL RATIO
CHOLESTEROL TOTAL: 118 mg/dL (ref 100–199)
HDL: 48 mg/dL (ref >39)
LDL Calculated: 60 mg/dL (ref 0–99)
Triglycerides: 51 mg/dL (ref 0–149)
VLDL CHOLESTEROL CAL: 10 mg/dL (ref 5–40)

## 2017-09-18 LAB — CBC
Hematocrit: 47.1 % (ref 37.5–51.0)
Hemoglobin: 16 g/dL (ref 13.0–17.7)
MCH: 30.1 pg (ref 26.6–33.0)
MCHC: 34 g/dL (ref 31.5–35.7)
MCV: 89 fL (ref 79–97)
PLATELETS: 171 10*3/uL (ref 150–450)
RBC: 5.31 x10E6/uL (ref 4.14–5.80)
RDW: 13.4 % (ref 12.3–15.4)
WBC: 5.4 10*3/uL (ref 3.4–10.8)

## 2017-09-18 LAB — PSA: Prostate Specific Ag, Serum: 2.8 ng/mL (ref 0.0–4.0)

## 2017-09-18 LAB — TSH: TSH: 1.76 u[IU]/mL (ref 0.450–4.500)

## 2017-09-20 LAB — UA/M W/RFLX CULTURE, ROUTINE
Bilirubin, UA: NEGATIVE
Glucose, UA: NEGATIVE
Ketones, UA: NEGATIVE
NITRITE UA: NEGATIVE
PH UA: 5.5 (ref 5.0–7.5)
Protein, UA: NEGATIVE
RBC, UA: NEGATIVE
Specific Gravity, UA: 1.024 (ref 1.005–1.030)
Urobilinogen, Ur: 0.2 mg/dL (ref 0.2–1.0)

## 2017-09-20 LAB — MICROSCOPIC EXAMINATION
CASTS: NONE SEEN /LPF
Epithelial Cells (non renal): NONE SEEN /hpf (ref 0–10)
RBC, UA: NONE SEEN /hpf (ref 0–2)

## 2017-09-20 LAB — URINE CULTURE, REFLEX

## 2017-09-21 DIAGNOSIS — M542 Cervicalgia: Secondary | ICD-10-CM | POA: Diagnosis not present

## 2017-09-21 DIAGNOSIS — M47812 Spondylosis without myelopathy or radiculopathy, cervical region: Secondary | ICD-10-CM | POA: Diagnosis not present

## 2017-09-21 DIAGNOSIS — M5030 Other cervical disc degeneration, unspecified cervical region: Secondary | ICD-10-CM | POA: Diagnosis not present

## 2017-10-01 ENCOUNTER — Other Ambulatory Visit: Payer: Self-pay

## 2017-10-01 MED ORDER — LISINOPRIL 40 MG PO TABS: ORAL_TABLET | ORAL | 5 refills | Status: DC

## 2017-12-14 DIAGNOSIS — R69 Illness, unspecified: Secondary | ICD-10-CM | POA: Diagnosis not present

## 2018-03-22 ENCOUNTER — Ambulatory Visit: Payer: Self-pay | Admitting: Nurse Practitioner

## 2018-03-22 ENCOUNTER — Ambulatory Visit: Payer: Medicare HMO | Admitting: Adult Health

## 2018-03-22 ENCOUNTER — Encounter: Payer: Self-pay | Admitting: Adult Health

## 2018-03-22 VITALS — BP 137/85 | HR 59 | Resp 16 | Ht 72.0 in | Wt 168.4 lb

## 2018-03-22 DIAGNOSIS — I10 Essential (primary) hypertension: Secondary | ICD-10-CM | POA: Diagnosis not present

## 2018-03-22 DIAGNOSIS — M542 Cervicalgia: Secondary | ICD-10-CM

## 2018-03-22 DIAGNOSIS — E782 Mixed hyperlipidemia: Secondary | ICD-10-CM | POA: Diagnosis not present

## 2018-03-22 MED ORDER — LISINOPRIL 40 MG PO TABS: ORAL_TABLET | ORAL | 2 refills | Status: DC

## 2018-03-22 MED ORDER — ATORVASTATIN CALCIUM 10 MG PO TABS: 10.0000 mg | ORAL_TABLET | Freq: Every day | ORAL | 2 refills | Status: DC

## 2018-03-22 MED ORDER — ETODOLAC 400 MG PO TABS: 400.0000 mg | ORAL_TABLET | Freq: Two times a day (BID) | ORAL | 3 refills | Status: DC | PRN

## 2018-03-22 NOTE — Progress Notes (Signed)
[facility] [address] [place], [place] [id number]  Internal MEDICINE  Office Visit Note  Patient Name: Michael Everett  [id number]  [id number]  Date of Service: 03/23/2018  Chief Complaint  Patient presents with  . Hypertension  . Hyperlipidemia    HPI Pt is here for follow up on HTN and HLD. Pt generally doing well, denies need at this time.  He is requesting refill on Etodolac.     Current Medication: Outpatient Encounter Medications as of 03/22/2018  Medication Sig  . aspirin EC 81 MG tablet Take 81 mg by mouth daily.  Marland Kitchen. atorvastatin (LIPITOR) 10 MG tablet Take 1 tablet (10 mg total) by mouth daily at 6 PM.  . etodolac (LODINE) 400 MG tablet Take 1 tablet (400 mg total) by mouth 2 (two) times daily as needed for moderate pain.  . fluticasone (FLONASE) 50 MCG/ACT nasal spray Place 1 spray into both nostrils daily.  Marland Kitchen. lisinopril (PRINIVIL,ZESTRIL) 40 MG tablet TAKE 1 TABLET BY MOUTH EVERY DAY FOR BLOOD PRESSURE CONTROL  . [DISCONTINUED] atorvastatin (LIPITOR) 10 MG tablet TAKE 1 TABLET BY MOUTH AT BEDTIME FOR CHOLESTEROL  . [DISCONTINUED] etodolac (LODINE) 400 MG tablet Take 1 tablet (400 mg total) by mouth 2 (two) times daily as needed for moderate pain.  . [DISCONTINUED] lisinopril (PRINIVIL,ZESTRIL) 40 MG tablet TAKE 1 TABLET BY MOUTH EVERY DAY FOR BLOOD PRESSURE CONTROL  . sildenafil (REVATIO) 20 MG tablet Take 2-3 tabs po 1 hr prior  To desired sexual activity   No facility-administered encounter medications on file as of 03/22/2018.     Surgical History: Past Surgical History:  Procedure Laterality Date  . cataract and lens implant right eye  2015  . CATARACT EXTRACTION, BILATERAL Bilateral 10/2013  . VASECTOMY      Medical History: Past Medical History:  Diagnosis Date  . Hyperlipidemia   . Hypertension     Family History: Family History  Problem Relation Age of Onset  . Hyperlipidemia Mother   . Heart disease Father     Social History    Socioeconomic History  . Marital status: Married    Spouse name: Not on file  . Number of children: Not on file  . Years of education: Not on file  . Highest education level: Not on file  Occupational History  . Not on file  Social Needs  . Financial resource strain: Not on file  . Food insecurity:    Worry: Not on file    Inability: Not on file  . Transportation needs:    Medical: Not on file    Non-medical: Not on file  Tobacco Use  . Smoking status: Never Smoker  . Smokeless tobacco: Never Used  Substance and Sexual Activity  . Alcohol use: No    Frequency: Never  . Drug use: No  . Sexual activity: Not on file  Lifestyle  . Physical activity:    Days per week: Not on file    Minutes per session: Not on file  . Stress: Not on file  Relationships  . Social connections:    Talks on phone: Not on file    Gets together: Not on file    Attends religious service: Not on file    Active member of club or organization: Not on file    Attends meetings of clubs or organizations: Not on file    Relationship status: Not on file  . Intimate partner violence:    Fear of current or ex partner:  Not on file    Emotionally abused: Not on file    Physically abused: Not on file    Forced sexual activity: Not on file  Other Topics Concern  . Not on file  Social History Narrative  . Not on file      Review of Systems  Constitutional: Negative.  Negative for chills, fatigue and unexpected weight change.  HENT: Negative.  Negative for congestion, rhinorrhea, sneezing and sore throat.   Eyes: Negative for redness.  Respiratory: Negative.  Negative for cough, chest tightness and shortness of breath.   Cardiovascular: Negative.  Negative for chest pain and palpitations.  Gastrointestinal: Negative.  Negative for abdominal pain, constipation, diarrhea, nausea and vomiting.  Endocrine: Negative.   Genitourinary: Negative.  Negative for dysuria and frequency.  Musculoskeletal:  Negative.  Negative for arthralgias, back pain, joint swelling and neck pain.  Skin: Negative.  Negative for rash.  Allergic/Immunologic: Negative.   Neurological: Negative.  Negative for tremors and numbness.  Hematological: Negative for adenopathy. Does not bruise/bleed easily.  Psychiatric/Behavioral: Negative.  Negative for behavioral problems, sleep disturbance and suicidal ideas. The patient is not nervous/anxious.     Vital Signs: BP 137/85   Pulse (!) 59   Resp 16   Ht 6' (1.829 m)   Wt 168 lb 6.4 oz (76.4 kg)   SpO2 100%   BMI 22.84 kg/m    Physical Exam Vitals signs and nursing note reviewed.  Constitutional:      General: He is not in acute distress.    Appearance: He is well-developed. He is not diaphoretic.  HENT:     Head: Normocephalic and atraumatic.     Mouth/Throat:     Pharynx: No oropharyngeal exudate.  Eyes:     Pupils: Pupils are equal, round, and reactive to light.  Neck:     Musculoskeletal: Normal range of motion and neck supple.     Thyroid: No thyromegaly.     Vascular: No JVD.     Trachea: No tracheal deviation.  Cardiovascular:     Rate and Rhythm: Normal rate and regular rhythm.     Heart sounds: Normal heart sounds. No murmur. No friction rub. No gallop.   Pulmonary:     Effort: Pulmonary effort is normal. No respiratory distress.     Breath sounds: Normal breath sounds. No wheezing or rales.  Chest:     Chest wall: No tenderness.  Abdominal:     Palpations: Abdomen is soft.     Tenderness: There is no abdominal tenderness. There is no guarding.  Musculoskeletal: Normal range of motion.  Lymphadenopathy:     Cervical: No cervical adenopathy.  Skin:    General: Skin is warm and dry.  Neurological:     Mental Status: He is alert and oriented to person, place, and time.     Cranial Nerves: No cranial nerve deficit.  Psychiatric:        Behavior: Behavior normal.        Thought Content: Thought content normal.        Judgment:  Judgment normal.    Assessment/Plan: 1. Cervicalgia Patient's etodolac refilled at this visit. - etodolac (LODINE) 400 MG tablet; Take 1 tablet (400 mg total) by mouth 2 (two) times daily as needed for moderate pain.  Dispense: 60 tablet; Refill: 3  2. Essential hypertension Stable continue current medications as prescribed  3. Mixed hyperlipidemia Stable continue current medications.  Patient's lipid panel will be reviewed at next physical in June.  General Counseling: Michael Everett verbalizes understanding of the findings of todays visit and agrees with plan of treatment. I have discussed any further diagnostic evaluation that may be needed or ordered today. We also reviewed his medications today. he has been encouraged to call the office with any questions or concerns that should arise related to todays visit.    No orders of the defined types were placed in this encounter.   Meds ordered this encounter  Medications  . etodolac (LODINE) 400 MG tablet    Sig: Take 1 tablet (400 mg total) by mouth 2 (two) times daily as needed for moderate pain.    Dispense:  60 tablet    Refill:  3  . atorvastatin (LIPITOR) 10 MG tablet    Sig: Take 1 tablet (10 mg total) by mouth daily at 6 PM.    Dispense:  90 tablet    Refill:  2  . lisinopril (PRINIVIL,ZESTRIL) 40 MG tablet    Sig: TAKE 1 TABLET BY MOUTH EVERY DAY FOR BLOOD PRESSURE CONTROL    Dispense:  90 tablet    Refill:  2    Time spent:25 Minutes   This patient was seen by Blima LedgerAdam Oree Hislop AGNP-C in Collaboration with Dr Lyndon CodeFozia M Khan as a part of collaborative care agreement     Michael AcostaAdam J. Alyda Everett AGNP-C Internal medicine

## 2018-03-22 NOTE — Patient Instructions (Signed)

## 2018-07-02 ENCOUNTER — Other Ambulatory Visit: Payer: Self-pay | Admitting: Adult Health

## 2018-07-02 DIAGNOSIS — M542 Cervicalgia: Secondary | ICD-10-CM

## 2018-07-23 ENCOUNTER — Other Ambulatory Visit: Payer: Self-pay | Admitting: Adult Health

## 2018-07-23 MED ORDER — FLUTICASONE PROPIONATE 50 MCG/ACT NA SUSP: 1.0000 | Freq: Every day | NASAL | 3 refills | Status: DC

## 2018-09-18 ENCOUNTER — Other Ambulatory Visit: Payer: Self-pay | Admitting: Adult Health

## 2018-09-18 DIAGNOSIS — R5381 Other malaise: Secondary | ICD-10-CM | POA: Diagnosis not present

## 2018-09-18 DIAGNOSIS — R972 Elevated prostate specific antigen [PSA]: Secondary | ICD-10-CM | POA: Diagnosis not present

## 2018-09-18 DIAGNOSIS — E0781 Sick-euthyroid syndrome: Secondary | ICD-10-CM | POA: Diagnosis not present

## 2018-09-18 DIAGNOSIS — E756 Lipid storage disorder, unspecified: Secondary | ICD-10-CM | POA: Diagnosis not present

## 2018-09-18 DIAGNOSIS — Z0001 Encounter for general adult medical examination with abnormal findings: Secondary | ICD-10-CM | POA: Diagnosis not present

## 2018-09-19 LAB — COMPREHENSIVE METABOLIC PANEL
ALT: 19 IU/L (ref 0–44)
AST: 18 IU/L (ref 0–40)
Albumin/Globulin Ratio: 1.9 (ref 1.2–2.2)
Albumin: 4.3 g/dL (ref 3.7–4.7)
Alkaline Phosphatase: 69 IU/L (ref 39–117)
BUN/Creatinine Ratio: 21 (ref 10–24)
BUN: 20 mg/dL (ref 8–27)
Bilirubin Total: 0.9 mg/dL (ref 0.0–1.2)
CO2: 25 mmol/L (ref 20–29)
Calcium: 9.5 mg/dL (ref 8.6–10.2)
Chloride: 104 mmol/L (ref 96–106)
Creatinine, Ser: 0.94 mg/dL (ref 0.76–1.27)
GFR calc Af Amer: 91 mL/min/{1.73_m2} (ref >59)
GFR calc non Af Amer: 79 mL/min/{1.73_m2} (ref >59)
Globulin, Total: 2.3 g/dL (ref 1.5–4.5)
Glucose: 89 mg/dL (ref 65–99)
Potassium: 4.5 mmol/L (ref 3.5–5.2)
Sodium: 142 mmol/L (ref 134–144)
Total Protein: 6.6 g/dL (ref 6.0–8.5)

## 2018-09-19 LAB — LIPID PANEL WITH LDL/HDL RATIO
Cholesterol, Total: 119 mg/dL (ref 100–199)
HDL: 49 mg/dL (ref >39)
LDL Calculated: 56 mg/dL (ref 0–99)
LDl/HDL Ratio: 1.1 ratio (ref 0.0–3.6)
Triglycerides: 68 mg/dL (ref 0–149)
VLDL Cholesterol Cal: 14 mg/dL (ref 5–40)

## 2018-09-19 LAB — CBC WITH DIFFERENTIAL/PLATELET
Basophils Absolute: 0 10*3/uL (ref 0.0–0.2)
Basos: 1 %
EOS (ABSOLUTE): 0.2 10*3/uL (ref 0.0–0.4)
Eos: 3 %
Hematocrit: 45.3 % (ref 37.5–51.0)
Hemoglobin: 15.2 g/dL (ref 13.0–17.7)
Immature Grans (Abs): 0 10*3/uL (ref 0.0–0.1)
Immature Granulocytes: 0 %
Lymphocytes Absolute: 1 10*3/uL (ref 0.7–3.1)
Lymphs: 21 %
MCH: 29.6 pg (ref 26.6–33.0)
MCHC: 33.6 g/dL (ref 31.5–35.7)
MCV: 88 fL (ref 79–97)
Monocytes Absolute: 0.5 10*3/uL (ref 0.1–0.9)
Monocytes: 10 %
Neutrophils Absolute: 3.1 10*3/uL (ref 1.4–7.0)
Neutrophils: 65 %
Platelets: 147 10*3/uL — ABNORMAL LOW (ref 150–450)
RBC: 5.13 x10E6/uL (ref 4.14–5.80)
RDW: 13.5 % (ref 11.6–15.4)
WBC: 4.8 10*3/uL (ref 3.4–10.8)

## 2018-09-19 LAB — PSA: Prostate Specific Ag, Serum: 2.6 ng/mL (ref 0.0–4.0)

## 2018-09-19 LAB — T4, FREE: Free T4: 1.16 ng/dL (ref 0.82–1.77)

## 2018-09-19 LAB — TSH: TSH: 2.25 u[IU]/mL (ref 0.450–4.500)

## 2018-09-25 ENCOUNTER — Encounter: Payer: Self-pay | Admitting: Adult Health

## 2018-09-25 ENCOUNTER — Other Ambulatory Visit: Payer: Self-pay

## 2018-09-25 ENCOUNTER — Ambulatory Visit (INDEPENDENT_AMBULATORY_CARE_PROVIDER_SITE_OTHER): Payer: Medicare HMO | Admitting: Internal Medicine

## 2018-09-25 DIAGNOSIS — I10 Essential (primary) hypertension: Secondary | ICD-10-CM

## 2018-09-25 DIAGNOSIS — R300 Dysuria: Secondary | ICD-10-CM

## 2018-09-25 DIAGNOSIS — G8929 Other chronic pain: Secondary | ICD-10-CM

## 2018-09-25 DIAGNOSIS — Z0001 Encounter for general adult medical examination with abnormal findings: Secondary | ICD-10-CM | POA: Diagnosis not present

## 2018-09-25 DIAGNOSIS — M542 Cervicalgia: Secondary | ICD-10-CM | POA: Diagnosis not present

## 2018-09-25 DIAGNOSIS — I7090 Unspecified atherosclerosis: Secondary | ICD-10-CM

## 2018-09-25 DIAGNOSIS — R011 Cardiac murmur, unspecified: Secondary | ICD-10-CM | POA: Diagnosis not present

## 2018-09-25 DIAGNOSIS — E782 Mixed hyperlipidemia: Secondary | ICD-10-CM

## 2018-09-25 DIAGNOSIS — G8928 Other chronic postprocedural pain: Secondary | ICD-10-CM | POA: Diagnosis not present

## 2018-09-25 NOTE — Progress Notes (Signed)
[facility] [address] [place], [place] [address]  Internal MEDICINE  Office Visit Note  Patient Name: Michael Everett  [id number]  [id number]  Date of Service: 10/01/2018  Chief Complaint  Patient presents with  . Medical Management of Chronic Issues    review labs   . Annual Exam    medicare annual wellness   . Hypertension  . Hyperlipidemia     HPI Pt is here for routine health maintenance examination. Pt has ongoing neck pain, Dizziness at times and numbness in his right shoulder, His X-rays few years ago was abnormal, slightly elevated bp as well, no chest pain or sob. ROM at the neck is limited somewhat, no problem with walking    Current Medication: Outpatient Encounter Medications as of 09/25/2018  Medication Sig  . aspirin EC 81 MG tablet Take 81 mg by mouth daily.  Marland Kitchen. atorvastatin (LIPITOR) 10 MG tablet Take 1 tablet (10 mg total) by mouth daily at 6 PM.  . etodolac (LODINE) 400 MG tablet TAKE 1 TABLET (400 MG TOTAL) BY MOUTH 2 (TWO) TIMES DAILY AS NEEDED FOR MODERATE PAIN.  . fluticasone (FLONASE) 50 MCG/ACT nasal spray Place 1 spray into both nostrils daily.  Marland Kitchen. lisinopril (PRINIVIL,ZESTRIL) 40 MG tablet TAKE 1 TABLET BY MOUTH EVERY DAY FOR BLOOD PRESSURE CONTROL  . sildenafil (REVATIO) 20 MG tablet Take 2-3 tabs po 1 hr prior  To desired sexual activity   No facility-administered encounter medications on file as of 09/25/2018.     Surgical History: Past Surgical History:  Procedure Laterality Date  . cataract and lens implant right eye  2015  . CATARACT EXTRACTION, BILATERAL Bilateral 10/2013  . VASECTOMY      Medical History: Past Medical History:  Diagnosis Date  . Hyperlipidemia   . Hypertension     Family History: Family History  Problem Relation Age of Onset  . Hyperlipidemia Mother   . Heart disease Father       Review of Systems  Constitutional: Negative for chills, fatigue and unexpected weight change.  HENT: Positive for  postnasal drip. Negative for congestion, rhinorrhea, sneezing and sore throat.   Eyes: Negative for redness.  Respiratory: Negative for cough, chest tightness and shortness of breath.   Cardiovascular: Negative for chest pain and palpitations.  Gastrointestinal: Negative for abdominal pain, constipation, diarrhea, nausea and vomiting.  Genitourinary: Negative for dysuria and frequency.  Musculoskeletal: Positive for arthralgias, neck pain and neck stiffness. Negative for back pain and joint swelling.  Skin: Negative for rash.  Neurological: Positive for dizziness and numbness. Negative for tremors.  Hematological: Negative for adenopathy. Does not bruise/bleed easily.  Psychiatric/Behavioral: Negative for behavioral problems (Depression), sleep disturbance and suicidal ideas. The patient is not nervous/anxious.      Vital Signs: BP 132/85 (BP Location: Left Arm, Patient Position: Sitting, Cuff Size: Normal)   Pulse 62   Resp 16   Ht 6' (1.829 m)   Wt 163 lb (73.9 kg)   SpO2 99%   BMI 22.11 kg/m    Physical Exam Constitutional:      General: He is not in acute distress.    Appearance: He is well-developed. He is not diaphoretic.  HENT:     Head: Normocephalic and atraumatic.     Mouth/Throat:     Pharynx: No oropharyngeal exudate.  Eyes:     Pupils: Pupils are equal, round, and reactive to light.  Neck:     Musculoskeletal: Normal range of motion and neck supple.  Thyroid: No thyromegaly.     Vascular: No JVD.     Trachea: No tracheal deviation.  Cardiovascular:     Rate and Rhythm: Normal rate and regular rhythm.     Heart sounds: Murmur present. No friction rub. No gallop.   Pulmonary:     Effort: Pulmonary effort is normal. No respiratory distress.     Breath sounds: No wheezing or rales.  Chest:     Chest wall: No tenderness.  Abdominal:     General: Bowel sounds are normal.     Palpations: Abdomen is soft.  Musculoskeletal: Normal range of motion.         General: Tenderness and deformity present.  Lymphadenopathy:     Cervical: No cervical adenopathy.  Skin:    General: Skin is warm and dry.  Neurological:     Mental Status: He is alert and oriented to person, place, and time.     Cranial Nerves: No cranial nerve deficit.     Sensory: Sensory deficit present.     Motor: Weakness present.  Psychiatric:        Mood and Affect: Mood normal.        Behavior: Behavior normal.        Thought Content: Thought content normal.        Judgment: Judgment normal.      LABS: Recent Results (from the past 2160 hour(s))  CBC with Differential/Platelet     Status: Abnormal   Collection Time: 09/18/18 10:43 AM  Result Value Ref Range   WBC 4.8 3.4 - 10.8 x10E3/uL   RBC 5.13 4.14 - 5.80 x10E6/uL   Hemoglobin 15.2 13.0 - 17.7 g/dL   Hematocrit 45.3 37.5 - 51.0 %   MCV 88 79 - 97 fL   MCH 29.6 26.6 - 33.0 pg   MCHC 33.6 31.5 - 35.7 g/dL   RDW 13.5 11.6 - 15.4 %   Platelets 147 (L) 150 - 450 x10E3/uL   Neutrophils 65 Not Estab. %   Lymphs 21 Not Estab. %   Monocytes 10 Not Estab. %   Eos 3 Not Estab. %   Basos 1 Not Estab. %   Neutrophils Absolute 3.1 1.4 - 7.0 x10E3/uL   Lymphocytes Absolute 1.0 0.7 - 3.1 x10E3/uL   Monocytes Absolute 0.5 0.1 - 0.9 x10E3/uL   EOS (ABSOLUTE) 0.2 0.0 - 0.4 x10E3/uL   Basophils Absolute 0.0 0.0 - 0.2 x10E3/uL   Immature Granulocytes 0 Not Estab. %   Immature Grans (Abs) 0.0 0.0 - 0.1 x10E3/uL  Comprehensive metabolic panel     Status: None   Collection Time: 09/18/18 10:43 AM  Result Value Ref Range   Glucose 89 65 - 99 mg/dL   BUN 20 8 - 27 mg/dL   Creatinine, Ser 0.94 0.76 - 1.27 mg/dL   GFR calc non Af Amer 79 >59 mL/min/1.73   GFR calc Af Amer 91 >59 mL/min/1.73   BUN/Creatinine Ratio 21 10 - 24   Sodium 142 134 - 144 mmol/L   Potassium 4.5 3.5 - 5.2 mmol/L   Chloride 104 96 - 106 mmol/L   CO2 25 20 - 29 mmol/L   Calcium 9.5 8.6 - 10.2 mg/dL   Total Protein 6.6 6.0 - 8.5 g/dL   Albumin 4.3  3.7 - 4.7 g/dL   Globulin, Total 2.3 1.5 - 4.5 g/dL   Albumin/Globulin Ratio 1.9 1.2 - 2.2   Bilirubin Total 0.9 0.0 - 1.2 mg/dL   Alkaline Phosphatase 69 39 - 117  IU/L   AST 18 0 - 40 IU/L   ALT 19 0 - 44 IU/L  Lipid Panel With LDL/HDL Ratio     Status: None   Collection Time: 09/18/18 10:43 AM  Result Value Ref Range   Cholesterol, Total 119 100 - 199 mg/dL   Triglycerides 68 0 - 149 mg/dL   HDL 49 >14>39 mg/dL   VLDL Cholesterol Cal 14 5 - 40 mg/dL   LDL Calculated 56 0 - 99 mg/dL   LDl/HDL Ratio 1.1 0.0 - 3.6 ratio    Comment:                                     LDL/HDL Ratio                                             Men  Women                               1/2 Avg.Risk  1.0    1.5                                   Avg.Risk  3.6    3.2                                2X Avg.Risk  6.2    5.0                                3X Avg.Risk  8.0    6.1   T4, free     Status: None   Collection Time: 09/18/18 10:43 AM  Result Value Ref Range   Free T4 1.16 0.82 - 1.77 ng/dL  TSH     Status: None   Collection Time: 09/18/18 10:43 AM  Result Value Ref Range   TSH 2.250 0.450 - 4.500 uIU/mL  PSA     Status: None   Collection Time: 09/18/18 10:43 AM  Result Value Ref Range   Prostate Specific Ag, Serum 2.6 0.0 - 4.0 ng/mL    Comment: Roche ECLIA methodology. According to the American Urological Association, Serum PSA should decrease and remain at undetectable levels after radical prostatectomy. The AUA defines biochemical recurrence as an initial PSA value 0.2 ng/mL or greater followed by a subsequent confirmatory PSA value 0.2 ng/mL or greater. Values obtained with different assay methods or kits cannot be used interchangeably. Results cannot be interpreted as absolute evidence of the presence or absence of malignant disease.   Urinalysis, Routine w reflex microscopic     Status: None   Collection Time: 09/25/18  9:16 AM  Result Value Ref Range   Specific Gravity, UA 1.016 1.005  - 1.030   pH, UA 5.0 5.0 - 7.5   Color, UA Yellow Yellow   Appearance Ur Clear Clear   Leukocytes,UA Negative Negative   Protein,UA Negative Negative/Trace   Glucose, UA Negative Negative   Ketones, UA Negative Negative   RBC, UA Negative Negative   Bilirubin, UA Negative Negative   Urobilinogen, Ur 0.2  0.2 - 1.0 mg/dL   Nitrite, UA Negative Negative   Microscopic Examination Comment     Comment: Microscopic not indicated and not performed.   Assessment/Plan: 1. Encounter for general adult medical examination with abnormal findings Labs reviewed and discussed   2. Essential hypertension - Monitor BP, continue meds as before   3. Atherosclerosis - H/O,will monitor  - ECHOCARDIOGRAM COMPLETE; Future - US Carotid Bilateral; Future  4. Undiagnosed cardiac murmurs - ECHOCARDIOGRAM COMPLETE; Future  5. 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 Dysuria - Urinalysis, Routine w reflex microscopic  7. Mixed hyperlipidemia - Controlled    General Counseling: Michael Everett verbalizes understanding of the findings of todays visit and agrees with plan of treatment. I have discussed any further diagnostic evaluation that may be needed or ordered today. We also reviewed his medications today. he has been encouraged to call the office with any questions or concerns that should arise related to todays visit. Counseling: Cardiac risk factor modification:  1. Control blood pressure. 2. Exercise as prescribed. 3. Follow low sodium, low fat diet. and low fat and low cholestrol diet. 4. Take ASA 81mg  once a day. 5. Restricted calories diet to lose weight.   Orders Placed This Encounter  Procedures  . MR Cervical Spine Wo Contrast  . DG Cervical Spine Complete  . US Carotid Bilateral  . Urinalysis, Routine  w reflex microscopic  . ECHOCARDIOGRAM COMPLETE     Time spent30 Minutes   Lyndon CodeFozia M Kadie Balestrieri, MD  Internal Medicine

## 2018-09-26 LAB — URINALYSIS, ROUTINE W REFLEX MICROSCOPIC
Bilirubin, UA: NEGATIVE
Glucose, UA: NEGATIVE
Ketones, UA: NEGATIVE
Leukocytes,UA: NEGATIVE
Nitrite, UA: NEGATIVE
Protein,UA: NEGATIVE
RBC, UA: NEGATIVE
Specific Gravity, UA: 1.016 (ref 1.005–1.030)
Urobilinogen, Ur: 0.2 mg/dL (ref 0.2–1.0)
pH, UA: 5 (ref 5.0–7.5)

## 2018-09-27 ENCOUNTER — Ambulatory Visit
Admission: RE | Admit: 2018-09-27 | Discharge: 2018-09-27 | Disposition: A | Discharge status: Home / Self Care | Payer: Medicare HMO | Source: Ambulatory Visit | Attending: Internal Medicine | Admitting: Internal Medicine

## 2018-09-27 ENCOUNTER — Other Ambulatory Visit: Payer: Self-pay

## 2018-09-27 DIAGNOSIS — M542 Cervicalgia: Secondary | ICD-10-CM | POA: Diagnosis not present

## 2018-10-11 DIAGNOSIS — M5412 Radiculopathy, cervical region: Secondary | ICD-10-CM | POA: Diagnosis not present

## 2018-10-11 DIAGNOSIS — M542 Cervicalgia: Secondary | ICD-10-CM | POA: Diagnosis not present

## 2018-10-14 ENCOUNTER — Other Ambulatory Visit: Payer: Self-pay | Admitting: Student

## 2018-10-14 DIAGNOSIS — M542 Cervicalgia: Secondary | ICD-10-CM

## 2018-10-14 DIAGNOSIS — M5412 Radiculopathy, cervical region: Secondary | ICD-10-CM

## 2018-10-25 ENCOUNTER — Ambulatory Visit: Payer: Medicare HMO

## 2018-10-25 ENCOUNTER — Other Ambulatory Visit: Payer: Self-pay

## 2018-10-25 DIAGNOSIS — I7090 Unspecified atherosclerosis: Secondary | ICD-10-CM

## 2018-10-25 DIAGNOSIS — R011 Cardiac murmur, unspecified: Secondary | ICD-10-CM

## 2018-10-25 DIAGNOSIS — E782 Mixed hyperlipidemia: Secondary | ICD-10-CM

## 2018-10-25 DIAGNOSIS — I10 Essential (primary) hypertension: Secondary | ICD-10-CM

## 2018-10-26 ENCOUNTER — Ambulatory Visit
Admission: RE | Admit: 2018-10-26 | Discharge: 2018-10-26 | Disposition: A | Discharge status: Home / Self Care | Payer: Medicare HMO | Source: Ambulatory Visit | Attending: Student | Admitting: Student

## 2018-10-26 DIAGNOSIS — M542 Cervicalgia: Secondary | ICD-10-CM | POA: Diagnosis not present

## 2018-10-26 DIAGNOSIS — M5412 Radiculopathy, cervical region: Secondary | ICD-10-CM | POA: Insufficient documentation

## 2018-11-08 ENCOUNTER — Ambulatory Visit (INDEPENDENT_AMBULATORY_CARE_PROVIDER_SITE_OTHER): Payer: Medicare HMO

## 2018-11-08 ENCOUNTER — Other Ambulatory Visit: Payer: Self-pay

## 2018-11-08 DIAGNOSIS — I10 Essential (primary) hypertension: Secondary | ICD-10-CM

## 2018-11-08 DIAGNOSIS — I6523 Occlusion and stenosis of bilateral carotid arteries: Secondary | ICD-10-CM

## 2018-11-08 DIAGNOSIS — E782 Mixed hyperlipidemia: Secondary | ICD-10-CM | POA: Diagnosis not present

## 2018-11-08 DIAGNOSIS — I7090 Unspecified atherosclerosis: Secondary | ICD-10-CM

## 2018-11-14 ENCOUNTER — Ambulatory Visit: Payer: Medicare HMO | Admitting: Nurse Practitioner

## 2018-11-14 ENCOUNTER — Encounter: Payer: Self-pay | Admitting: Nurse Practitioner

## 2018-11-14 VITALS — BP 141/79 | HR 59 | Resp 16 | Ht 72.0 in | Wt 159.6 lb

## 2018-11-14 DIAGNOSIS — R0683 Snoring: Secondary | ICD-10-CM | POA: Diagnosis not present

## 2018-11-14 DIAGNOSIS — M542 Cervicalgia: Secondary | ICD-10-CM

## 2018-11-14 DIAGNOSIS — I5189 Other ill-defined heart diseases: Secondary | ICD-10-CM

## 2018-11-14 DIAGNOSIS — I10 Essential (primary) hypertension: Secondary | ICD-10-CM

## 2018-11-14 NOTE — Procedures (Signed)
[place], [place] [address]  DATE OF SERVICE: November 08, 2018  CAROTID DOPPLER INTERPRETATION:  Bilateral Carotid Ultrsasound and Color Doppler Examination was performed. The RIGHT CCA shows no significant plaque in the vessel. The LEFT CCA shows no significant plaque in the vessel. There was significant intimal thickening noted in the RIGHT carotid artery. There was significant intimal thickening in the LEFT carotid artery.  The RIGHT CCA shows peak systolic velocity of 60 cm per second. The end diastolic velocity is 15 cm per second on the RIGHT side. The RIGHT ICA shows peak systolic velocity of 56 per second. RIGHT sided ICA end diastolic velocity is 20 cm per second. The RIGHT ECA shows a peak systolic velocity of 91 cm per second. The ICA/CCA ratio is calculated to be 0.93. This suggests less than 50% stenosis. The Vertebral Artery shows antegrade flow.  The LEFT CCA shows peak systolic velocity of 63 cm per second. The end diastolic velocity is 19 cm per second on the LEFT side. The LEFT ICA shows peak systolic velocity of 67 per second. LEFT sided ICA end diastolic velocity is 27 cm per second. The LEFT ECA shows a peak systolic velocity of 61 cm per second. The ICA/CCA ratio is calculated to be 1.08. This suggests less than 50% stenosis. The Vertebral Artery shows antegrade flow.   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 The LEFT CAROTID shows less than 50% stenosis.  There is no significant plaque formation noted on the LEFT and no significant on the RIGHT  side. Consider a repeat Carotid doppler if clinical situation and symptoms warrant in 6-12 months. Patient should be encouraged to change lifestyles such as smoking cessation, regular exercise and dietary modification. Use of statins in the right clinical setting and ASA is encouraged.  Allyne Gee, MD [facility] Pulmonary Critical Care Medicine

## 2018-11-14 NOTE — Progress Notes (Signed)
[facility] [address] [place], [place] [address]  Internal MEDICINE  Office Visit Note  Patient Name: Michael Everett  604540Dec 03, 2047  [id number]  Date of Service: 11/17/2018  Chief Complaint  Patient presents with  . Neck Pain  . Fatigue    The patient is here for follow up. Has chronic neck pain over past two years or longer. Has seen orthopedic provider and had MRI of the cervical spine since he was last seen. 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 He has another, less severe osteophyte formation at C5/C6. He is scheduled to have injection into the neck to help with pain next Thursday at the orthopedic office.       Current Medication: Outpatient Encounter Medications as of 11/14/2018  Medication Sig  . aspirin EC 81 MG tablet Take 81 mg by mouth daily.  Marland Kitchen. atorvastatin (LIPITOR) 10 MG tablet Take 1 tablet (10 mg total) by mouth daily at 6 PM.  . etodolac (LODINE) 400 MG tablet TAKE 1 TABLET (400 MG TOTAL) BY MOUTH 2 (TWO) TIMES DAILY AS NEEDED FOR MODERATE PAIN.  . fluticasone (FLONASE) 50 MCG/ACT nasal spray Place 1 spray into both nostrils daily.  Marland Kitchen. lisinopril (PRINIVIL,ZESTRIL) 40 MG tablet TAKE 1 TABLET BY MOUTH EVERY DAY FOR BLOOD PRESSURE CONTROL  . sildenafil (REVATIO) 20 MG tablet Take 2-3 tabs po 1 hr prior  To desired sexual activity   No facility-administered encounter medications on file as of 11/14/2018.     Surgical History: Past Surgical History:  Procedure Laterality Date  . cataract and lens implant right eye  2015  . CATARACT EXTRACTION, BILATERAL Bilateral 10/2013  . VASECTOMY      Medical History: Past Medical History:  Diagnosis Date  . Hyperlipidemia   . Hypertension     Family History: Family History  Problem Relation Age of Onset  . Hyperlipidemia Mother   . Heart disease Father     Social History   Socioeconomic History  . Marital status: Married    Spouse  name: Not on file  . Number of children: Not on file  . Years of education: Not on file  . Highest education level: Not on file  Occupational History  . Not on file  Social Needs  . Financial resource strain: Not on file  . Food insecurity    Worry: Not on file    Inability: Not on file  . Transportation needs    Medical: Not on file    Non-medical: Not on file  Tobacco Use  . Smoking status: Never Smoker  . Smokeless tobacco: Never Used  Substance and Sexual Activity  . Alcohol use: No    Frequency: Never  . Drug use: No  . Sexual activity: Not on file  Lifestyle  . Physical activity    Days per week: Not on file    Minutes per session: Not on file  . Stress: Not on file  Relationships  . Social Musicianconnections    Talks on phone: Not on file    Gets together: Not on file    Attends religious service: Not on file    Active member of club or organization: Not on file    Attends meetings of clubs or organizations: Not on file    Relationship status: Not on file  . Intimate partner violence    Fear of current or ex partner: Not on file    Emotionally abused: Not on  file    Physically abused: Not on file    Forced sexual activity: Not on file  Other Topics Concern  . Not on file  Social History Narrative  . Not on file      Review of Systems  Constitutional: Positive for activity change and fatigue. Negative for chills and unexpected weight change.  HENT: Negative for congestion, postnasal drip, rhinorrhea, sneezing and sore throat.   Respiratory: Negative for cough, chest tightness, shortness of breath and wheezing.   Cardiovascular: Negative for chest pain and palpitations.       Mildly elevated blood pressure today.   Gastrointestinal: Negative for abdominal pain, constipation, diarrhea, nausea and vomiting.  Endocrine: Negative for cold intolerance, heat intolerance, polydipsia and polyuria.  Musculoskeletal: Positive for arthralgias, neck pain and neck stiffness.  Negative for back pain and joint swelling.  Skin: Negative for rash.  Neurological: Positive for dizziness and numbness. Negative for tremors.  Hematological: Negative for adenopathy. Does not bruise/bleed easily.  Psychiatric/Behavioral: Negative for behavioral problems (Depression), sleep disturbance and suicidal ideas. The patient is not nervous/anxious.     Today's Vitals   11/14/18 1616  BP: (!) 141/79  Pulse: (!) 59  Resp: 16  SpO2: 100%  Weight: 159 lb 9.6 oz (72.4 kg)  Height: 6' (1.829 m)   Body mass index is 21.65 kg/m.  Physical Exam Vitals signs and nursing note reviewed.  Constitutional:      General: He is not in acute distress.    Appearance: Normal appearance. He is well-developed. He is not diaphoretic.  HENT:     Head: Normocephalic and atraumatic.     Mouth/Throat:     Pharynx: No oropharyngeal exudate.  Eyes:     Pupils: Pupils are equal, round, and reactive to light.  Neck:     Musculoskeletal: Normal range of motion and neck supple.     Thyroid: No thyromegaly.     Vascular: No JVD.     Trachea: No tracheal deviation.  Cardiovascular:     Rate and Rhythm: Normal rate and regular rhythm.     Heart sounds: Murmur present. No friction rub. No gallop.   Pulmonary:     Effort: Pulmonary effort is normal. No respiratory distress.     Breath sounds: Normal breath sounds. No wheezing or rales.  Chest:     Chest wall: No tenderness.  Abdominal:     Palpations: Abdomen is soft.  Musculoskeletal: Normal range of motion.        General: Tenderness and deformity present.  Lymphadenopathy:     Cervical: No cervical adenopathy.  Skin:    General: Skin is warm and dry.  Neurological:     Mental Status: He is alert and oriented to person, place, and time.     Cranial Nerves: No cranial nerve deficit.     Sensory: Sensory deficit present.     Motor: Weakness present.  Psychiatric:        Mood and Affect: Mood normal.        Behavior: Behavior normal.         Thought Content: Thought content normal.        Judgment: Judgment normal.    Assessment/Plan: 1. Diastolic dysfunction Reviewed echocardiogram with patient. Showed grade II diastolic dysfunction. Will get home sleep study for further evaluation of possible sleep apnea.  - Home sleep test  2. Snoring Home sleep study ordered for further evaluation of possible sleep apnea.  - Home sleep test  3. Essential  hypertension Generally stable. Continue bp medication as prescribed.  4. Cervicalgia Follow up with orthopedics as scheduled.   General Counseling: malikiah hoole understanding of the findings of todays visit and agrees with plan of treatment. I have discussed any further diagnostic evaluation that may be needed or ordered today. We also reviewed his medications today. he has been encouraged to call the office with any questions or concerns that should arise related to todays visit.  Patient has sign and symptoms of OSA ( disturbed sleep, excessive fatigue during the day, uncontrolled bp and abnormal BMI). Baseline sleep study is ordered to further look into this. Long term complications of OSA was addressed with the patient.  This patient was seen by Vincent Gros FNP Collaboration with Dr Lyndon Code as a part of collaborative care agreement  Orders Placed This Encounter  Procedures  . Home sleep test      Time spent: 5 Minutes      Dr Lyndon Code Internal medicine

## 2018-11-17 DIAGNOSIS — I5189 Other ill-defined heart diseases: Secondary | ICD-10-CM | POA: Insufficient documentation

## 2018-11-17 DIAGNOSIS — R0683 Snoring: Secondary | ICD-10-CM | POA: Insufficient documentation

## 2018-11-18 ENCOUNTER — Other Ambulatory Visit: Payer: Medicare HMO | Admitting: Internal Medicine

## 2018-11-18 DIAGNOSIS — H35371 Puckering of macula, right eye: Secondary | ICD-10-CM | POA: Diagnosis not present

## 2018-11-20 ENCOUNTER — Other Ambulatory Visit: Payer: Medicare HMO | Admitting: Internal Medicine

## 2018-11-20 ENCOUNTER — Other Ambulatory Visit: Payer: Self-pay

## 2018-11-20 DIAGNOSIS — G4733 Obstructive sleep apnea (adult) (pediatric): Secondary | ICD-10-CM | POA: Diagnosis not present

## 2018-11-21 DIAGNOSIS — M5030 Other cervical disc degeneration, unspecified cervical region: Secondary | ICD-10-CM | POA: Diagnosis not present

## 2018-11-21 DIAGNOSIS — M5412 Radiculopathy, cervical region: Secondary | ICD-10-CM | POA: Diagnosis not present

## 2018-11-27 ENCOUNTER — Ambulatory Visit: Payer: Medicare HMO | Admitting: Internal Medicine

## 2018-11-27 ENCOUNTER — Encounter: Payer: Self-pay | Admitting: Internal Medicine

## 2018-11-27 ENCOUNTER — Other Ambulatory Visit: Payer: Self-pay

## 2018-11-27 VITALS — BP 124/78 | HR 65 | Resp 16 | Ht 72.0 in | Wt 160.0 lb

## 2018-11-27 DIAGNOSIS — G4733 Obstructive sleep apnea (adult) (pediatric): Secondary | ICD-10-CM

## 2018-11-27 DIAGNOSIS — I10 Essential (primary) hypertension: Secondary | ICD-10-CM

## 2018-11-27 DIAGNOSIS — I5189 Other ill-defined heart diseases: Secondary | ICD-10-CM | POA: Diagnosis not present

## 2018-11-27 NOTE — Progress Notes (Signed)
[facility] [place], [place] [address]  Pulmonary Sleep Medicine   Office Visit Note  Patient Name: Michael Everett DOB: 1946-02-13 MRN [id number]  Date of Service: 11/27/2018  Complaints/HPI: Pt seen today for follow up on home sleep study.  His overall AHI was 10.7.  He had a total of 58 obstructive apneas as well as 1 central apnea.  There also 32 hypopneas seen.  This study is consistent with mild obstructive sleep apnea.   ROS  General: (-) fever, (-) chills, (-) night sweats, (-) weakness Skin: (-) rashes, (-) itching,. Eyes: (-) visual changes, (-) redness, (-) itching. Nose and Sinuses: (-) nasal stuffiness or itchiness, (-) postnasal drip, (-) nosebleeds, (-) sinus trouble. Mouth and Throat: (-) sore throat, (-) hoarseness. Neck: (-) swollen glands, (-) enlarged thyroid, (-) neck pain. Respiratory: - cough, (-) bloody sputum, - shortness of breath, - wheezing. Cardiovascular: - ankle swelling, (-) chest pain. Lymphatic: (-) lymph node enlargement. Neurologic: (-) numbness, (-) tingling. Psychiatric: (-) anxiety, (-) depression   Current Medication: Outpatient Encounter Medications as of 11/27/2018  Medication Sig  . aspirin EC 81 MG tablet Take 81 mg by mouth daily.  Marland Kitchen atorvastatin (LIPITOR) 10 MG tablet Take 1 tablet (10 mg total) by mouth daily at 6 PM.  . etodolac (LODINE) 400 MG tablet TAKE 1 TABLET (400 MG TOTAL) BY MOUTH 2 (TWO) TIMES DAILY AS NEEDED FOR MODERATE PAIN.  . fluticasone (FLONASE) 50 MCG/ACT nasal spray Place 1 spray into both nostrils daily.  Marland Kitchen lisinopril (PRINIVIL,ZESTRIL) 40 MG tablet TAKE 1 TABLET BY MOUTH EVERY DAY FOR BLOOD PRESSURE CONTROL  . sildenafil (REVATIO) 20 MG tablet Take 2-3 tabs po 1 hr prior  To desired sexual activity   No facility-administered encounter medications on file as of 11/27/2018.     Surgical History: Past Surgical History:  Procedure Laterality Date  . cataract and lens implant right eye   2015  . CATARACT EXTRACTION, BILATERAL Bilateral 10/2013  . VASECTOMY      Medical History: Past Medical History:  Diagnosis Date  . Hyperlipidemia   . Hypertension     Family History: Family History  Problem Relation Age of Onset  . Hyperlipidemia Mother   . Heart disease Father     Social History: Social History   Socioeconomic History  . Marital status: Married    Spouse name: Not on file  . Number of children: Not on file  . Years of education: Not on file  . Highest education level: Not on file  Occupational History  . Not on file  Social Needs  . Financial resource strain: Not on file  . Food insecurity    Worry: Not on file    Inability: Not on file  . Transportation needs    Medical: Not on file    Non-medical: Not on file  Tobacco Use  . Smoking status: Never Smoker  . Smokeless tobacco: Never Used  Substance and Sexual Activity  . Alcohol use: No    Frequency: Never  . Drug use: No  . Sexual activity: Not on file  Lifestyle  . Physical activity    Days per week: Not on file    Minutes per session: Not on file  . Stress: Not on file  Relationships  . Social Herbalist on phone: Not on file    Gets together: Not on file    Attends religious service: Not on file    Active  member of club or organization: Not on file    Attends meetings of clubs or organizations: Not on file    Relationship status: Not on file  . Intimate partner violence    Fear of current or ex partner: Not on file    Emotionally abused: Not on file    Physically abused: Not on file    Forced sexual activity: Not on file  Other Topics Concern  . Not on file  Social History Narrative  . Not on file    Vital Signs: Blood pressure 124/78, pulse 65, resp. rate 16, height 6' (1.829 m), weight 160 lb (72.6 kg), SpO2 98 %.  Examination: General Appearance: The patient is well-developed, well-nourished, and in no distress. Skin: Gross inspection of skin  unremarkable. Head: normocephalic, no gross deformities. Eyes: no gross deformities noted. ENT: ears appear grossly normal no exudates. Neck: Supple. No thyromegaly. No LAD. Respiratory: clear bilateraly. Cardiovascular: Normal S1 and S2 without murmur or rub. Extremities: No cyanosis. pulses are equal. Neurologic: Alert and oriented. No involuntary movements.  LABS: Recent Results (from the past 2160 hour(s))  CBC with Differential/Platelet     Status: Abnormal   Collection Time: 09/18/18 10:43 AM  Result Value Ref Range   WBC 4.8 3.4 - 10.8 x10E3/uL   RBC 5.13 4.14 - 5.80 x10E6/uL   Hemoglobin 15.2 13.0 - 17.7 g/dL   Hematocrit 96.045.3 45.437.5 - 51.0 %   MCV 88 79 - 97 fL   MCH 29.6 26.6 - 33.0 pg   MCHC 33.6 31.5 - 35.7 g/dL   RDW 09.813.5 11.911.6 - 14.715.4 %   Platelets 147 (L) 150 - 450 x10E3/uL   Neutrophils 65 Not Estab. %   Lymphs 21 Not Estab. %   Monocytes 10 Not Estab. %   Eos 3 Not Estab. %   Basos 1 Not Estab. %   Neutrophils Absolute 3.1 1.4 - 7.0 x10E3/uL   Lymphocytes Absolute 1.0 0.7 - 3.1 x10E3/uL   Monocytes Absolute 0.5 0.1 - 0.9 x10E3/uL   EOS (ABSOLUTE) 0.2 0.0 - 0.4 x10E3/uL   Basophils Absolute 0.0 0.0 - 0.2 x10E3/uL   Immature Granulocytes 0 Not Estab. %   Immature Grans (Abs) 0.0 0.0 - 0.1 x10E3/uL  Comprehensive metabolic panel     Status: None   Collection Time: 09/18/18 10:43 AM  Result Value Ref Range   Glucose 89 65 - 99 mg/dL   BUN 20 8 - 27 mg/dL   Creatinine, Ser 8.290.94 0.76 - 1.27 mg/dL   GFR calc non Af Amer 79 >59 mL/min/1.73   GFR calc Af Amer 91 >59 mL/min/1.73   BUN/Creatinine Ratio 21 10 - 24   Sodium 142 134 - 144 mmol/L   Potassium 4.5 3.5 - 5.2 mmol/L   Chloride 104 96 - 106 mmol/L   CO2 25 20 - 29 mmol/L   Calcium 9.5 8.6 - 10.2 mg/dL   Total Protein 6.6 6.0 - 8.5 g/dL   Albumin 4.3 3.7 - 4.7 g/dL   Globulin, Total 2.3 1.5 - 4.5 g/dL   Albumin/Globulin Ratio 1.9 1.2 - 2.2   Bilirubin Total 0.9 0.0 - 1.2 mg/dL   Alkaline Phosphatase 69  39 - 117 IU/L   AST 18 0 - 40 IU/L   ALT 19 0 - 44 IU/L  Lipid Panel With LDL/HDL Ratio     Status: None   Collection Time: 09/18/18 10:43 AM  Result Value Ref Range   Cholesterol, Total 119 100 - 199 mg/dL  Triglycerides 68 0 - 149 mg/dL   HDL 49 >38 mg/dL   VLDL Cholesterol Cal 14 5 - 40 mg/dL   LDL Calculated 56 0 - 99 mg/dL   LDl/HDL Ratio 1.1 0.0 - 3.6 ratio    Comment:                                     LDL/HDL Ratio                                             Men  Women                               1/2 Avg.Risk  1.0    1.5                                   Avg.Risk  3.6    3.2                                2X Avg.Risk  6.2    5.0                                3X Avg.Risk  8.0    6.1   T4, free     Status: None   Collection Time: 09/18/18 10:43 AM  Result Value Ref Range   Free T4 1.16 0.82 - 1.77 ng/dL  TSH     Status: None   Collection Time: 09/18/18 10:43 AM  Result Value Ref Range   TSH 2.250 0.450 - 4.500 uIU/mL  PSA     Status: None   Collection Time: 09/18/18 10:43 AM  Result Value Ref Range   Prostate Specific Ag, Serum 2.6 0.0 - 4.0 ng/mL    Comment: Roche ECLIA methodology. According to the American Urological Association, Serum PSA should decrease and remain at undetectable levels after radical prostatectomy. The AUA defines biochemical recurrence as an initial PSA value 0.2 ng/mL or greater followed by a subsequent confirmatory PSA value 0.2 ng/mL or greater. Values obtained with different assay methods or kits cannot be used interchangeably. Results cannot be interpreted as absolute evidence of the presence or absence of malignant disease.   Urinalysis, Routine w reflex microscopic     Status: None   Collection Time: 09/25/18  9:16 AM  Result Value Ref Range   Specific Gravity, UA 1.016 1.005 - 1.030   pH, UA 5.0 5.0 - 7.5   Color, UA Yellow Yellow   Appearance Ur Clear Clear   Leukocytes,UA Negative Negative   Protein,UA Negative  Negative/Trace   Glucose, UA Negative Negative   Ketones, UA Negative Negative   RBC, UA Negative Negative   Bilirubin, UA Negative Negative   Urobilinogen, Ur 0.2 0.2 - 1.0 mg/dL   Nitrite, UA Negative Negative   Microscopic Examination Comment     Comment: Microscopic not indicated and not performed.    Radiology: Mr Cervical Spine Wo Contrast  Result Date: 10/27/2018 CLINICAL DATA:  Neck pain for 2 years radiating to both shoulders, right  greater than left. EXAM: MRI CERVICAL SPINE WITHOUT CONTRAST TECHNIQUE: Multiplanar, multisequence MR imaging of the cervical spine was performed. No intravenous contrast was administered. COMPARISON:  None. FINDINGS: Alignment: Grade 1 retrolisthesis at C3-4. Vertebrae: There is fusion of the C5-6 levels anteriorly. Cord: Normal Posterior Fossa, vertebral arteries, paraspinal tissues: Visualized posterior fossa is normal. Vertebral artery flow voids are preserved. No prevertebral soft tissue swelling. Disc levels: C1-C2: Normal. C2-C3: Normal disc space and facets. No spinal canal or neuroforaminal stenosis. C3-C4: Large right uncovertebral osteophyte causes severe right neural foraminal stenosis. No central spinal canal stenosis. Mild left foraminal stenosis due to uncovertebral hypertrophy. C4-C5: Right-greater-than-left facet hypertrophy. Small disc bulge. No spinal canal stenosis. Mild right foraminal stenosis. C5-C6: Anterior fusion. Bilateral uncovertebral spurring, left-greater-than-right with mild right, moderate left neural foraminal stenosis. No spinal canal stenosis. C6-C7: Anterior fusion. No spinal canal or neural foraminal stenosis. C7-T1: Normal T1-2 and T2-3 are imaged only in the sagittal plane and are normal. IMPRESSION: 1. 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 2. Mild right, moderate left C6 neural foraminal stenosis. Electronically Signed   By: Deatra Nakata M.D.   On: 10/27/2018 00:12    No results  found.  US Carotid Bilateral  Result Date: 11/08/2018 Yevonne Pax, MD     11/14/2018  6:38 PM [facility] [address] [place], [place] [address] DATE OF SERVICE: November 08, 2018 CAROTID DOPPLER INTERPRETATION: Bilateral Carotid Ultrsasound and Color Doppler Examination was performed. The RIGHT CCA shows no significant plaque in the vessel. The LEFT CCA shows no significant plaque in the vessel. There was significant intimal thickening noted in the RIGHT carotid artery. There was significant intimal thickening in the LEFT carotid artery. The RIGHT CCA shows peak systolic velocity of 60 cm per second. The end diastolic velocity is 15 cm per second on the RIGHT side. The RIGHT ICA shows peak systolic velocity of 56 per second. RIGHT sided ICA end diastolic velocity is 20 cm per second. The RIGHT ECA shows a peak systolic velocity of 91 cm per second. The ICA/CCA ratio is calculated to be 0.93. This suggests less than 50% stenosis. The Vertebral Artery shows antegrade flow. The LEFT CCA shows peak systolic velocity of 63 cm per second. The end diastolic velocity is 19 cm per second on the LEFT side. The LEFT ICA shows peak systolic velocity of 67 per second. LEFT sided ICA end diastolic velocity is 27 cm per second. The LEFT ECA shows a peak systolic velocity of 61 cm per second. The ICA/CCA ratio is calculated to be 1.08. This suggests less than 50% stenosis. The Vertebral Artery shows antegrade flow. Impression:  The RIGHT CAROTID shows less than 50% stenosis. The LEFT CAROTID shows less than 50% stenosis.  There is no significant plaque formation noted on the LEFT and no significant on the RIGHT  side. Consider a repeat Carotid doppler if clinical situation and symptoms warrant in 6-12 months. Patient should be encouraged to change lifestyles such as smoking cessation, regular exercise and dietary modification. Use of statins in the right clinical setting and ASA is encouraged. Yevonne Pax, MD  [facility] Pulmonary Critical Care Medicine      Assessment and Plan: Patient Active Problem List   Diagnosis Date Noted  . Diastolic dysfunction 11/17/2018  . Snoring 11/17/2018  . Special screening for malignant neoplasm of prostate 09/17/2017  . Need for vaccination against Streptococcus pneumoniae using pneumococcal conjugate vaccine 13 09/17/2017  . Dysuria 09/17/2017  .  Hypertension 04/29/2017  . Hyperlipidemia 04/29/2017  . Cervicalgia 04/29/2017    1. OSA (obstructive sleep apnea) An auto titrating machine ordered for patient to treat his OSA.  We will follow-up after delivery.  2. Diastolic dysfunction Diastolic dysfunction likely related to untreated OSA.  We will continue to monitor.  3. Essential hypertension Stable, continue present management.  General Counseling: I have discussed the findings of the evaluation and examination with Alden ServerErnest.  I have also discussed any further diagnostic evaluation thatmay be needed or ordered today. Alden Serverrnest verbalizes understanding of the findings of todays visit. We also reviewed his medications today and discussed drug interactions and side effects including but not limited excessive drowsiness and altered mental states. We also discussed that there is always a risk not just to him but also people around him. he has been encouraged to call the office with any questions or concerns that should arise related to todays visit.    Time spent: 15 This patient was seen by Blima LedgerAdam Samyra Limb AGNP-C in Collaboration with Dr. Freda MunroSaadat Khan as a part of collaborative care agreement.   I have personally obtained a history, examined the patient, evaluated laboratory and imaging results, formulated the assessment and plan and placed orders.    Yevonne PaxSaadat A Khan, MD [facility] Pulmonary and Critical Care Sleep medicine

## 2018-12-06 NOTE — Procedures (Signed)
[facility] [place], [place] [address]  Sleep Specialist: Allyne Gee, MD [facility] Sleep Study Interpretation  Patient Name: Michael Everett Patient MR [id number] DOB:1945-12-22  Date of Study: November 20, 2018  Indications for study: Excessive daytime somnolence snoring  BMI: 21.5 kg/m       Respiratory Data:  Total AHI: 10.7/h  Total Obstructive Apneas: 58  Total Central Apneas: 1  Total Mixed Apneas: 0  Total Hypopneas: 32  If the AHI is greater than 5 per hour patient qualifies for PAP evaluation  Oximetry Data:  Oxygen Desaturation Index: 6.7/h  Lowest Desaturation: 82%  Cardiac Data:  Minimum Heart Rate: 43  Maximum Heart Rate: 81   Impression / Diagnosis:  This apnea study demonstrates presence of mild obstructive sleep apnea with an apnea hypotony index of 10.7/h.  The patient also did have significant oxygen desaturations down to 82%.  Recommend follow-up with CPAP titration due to patient having significant symptoms as well as significant oxygen desaturations.  GENERAL Recommendations:  1.  Consider Auto PAP with pressure ranges 5-20 cmH20 with download, or facility based PAP Titration Study  2.  Consider PAP interface mask fitted for patient comfort, Heated Humidification & PAP compliance monitoring (1 month, 3 months & 12 months after PAP initiation)  3. Consider treatment with mandibular advancement splint (MAS) or referral to an ENT surgeon for modification to the upper airway if the patient prefers an alternate therapy or the PAP trial is unsuccessful  4. Sleep hygiene measures should be discussed with the patient  5. Behavioral therapy such as weight reduction or smoking cessation as appropriate for the patient  6. Advise patient against the use of alcohol or sedatives in so much as these substances can worsen excessive daytime sleepiness and respiratory disturbances of sleep  7. Advise patient  against participating in potentially dangerous activities while drowsy such as operating a motor vehicle, heavy equipment or power tools as it can put them and others in danger  8. Advise patient of the long term consequences of OSA if left untreated, need for treatment and close follow up  9. Clinical follow up as deemed necessary     This Level III home sleep study was performed using the [employer], a 4 channel screening device subject to limitations. Depending on actual total sleep time, not measured in this study, the AHI (sum of apneas and hypopneas/hr of sleep) and therefore the severity of sleep apnea may be underestimated. As with any single night study, including Level 1 attended PSG, severity of sleep apnea may also be underestimated due to the lack of supine and/or REM sleep.  The interpretation associated with this report is based on normal values and degrees of severity in accordance with AASM parameters and/or estimated from multiple sources in the literature for adults ages 24-80+. These may not agree with the displayed values. The patient's treating physician should use the interpretation and recommendations in conjunction with the overall clinical evaluation and treatment of the patient.  Some of the terminology used in this scored [facility]nea[facility] report was developed several years ago and may not always be in accordance with current nomenclature. This in no way affects the accuracy of the data or the reliability of the interpretation and recommendations.

## 2018-12-11 ENCOUNTER — Other Ambulatory Visit: Payer: Self-pay

## 2018-12-11 ENCOUNTER — Ambulatory Visit: Payer: Medicare HMO

## 2018-12-11 DIAGNOSIS — G4733 Obstructive sleep apnea (adult) (pediatric): Secondary | ICD-10-CM

## 2018-12-11 NOTE — Progress Notes (Signed)
New cpap setup:  Resmed Auto cpap S-10 at 5-15 cmH2o with a heated humififier and climate line, Pt chose a resmed F-20 full face mask Med. He had a good understanding of compliance, cleaning, and follow ups associated with cpap. No problems or questions at this time

## 2018-12-19 DIAGNOSIS — M5412 Radiculopathy, cervical region: Secondary | ICD-10-CM | POA: Diagnosis not present

## 2018-12-19 DIAGNOSIS — M4802 Spinal stenosis, cervical region: Secondary | ICD-10-CM | POA: Diagnosis not present

## 2018-12-20 DIAGNOSIS — Z23 Encounter for immunization: Secondary | ICD-10-CM | POA: Diagnosis not present

## 2018-12-25 ENCOUNTER — Other Ambulatory Visit: Payer: Self-pay | Admitting: Adult Health

## 2018-12-31 NOTE — Progress Notes (Signed)
I see him 02/04/2019. Was supposed to see him back after he had home sleep study. He had diastolic dysfunction on echo. Not sure when sleep study was scheduled for. He is following up every 3 months from what I can see.

## 2019-01-10 DIAGNOSIS — G4733 Obstructive sleep apnea (adult) (pediatric): Secondary | ICD-10-CM | POA: Diagnosis not present

## 2019-01-15 ENCOUNTER — Other Ambulatory Visit: Payer: Self-pay

## 2019-01-15 ENCOUNTER — Ambulatory Visit: Payer: Medicare HMO

## 2019-01-15 DIAGNOSIS — G4733 Obstructive sleep apnea (adult) (pediatric): Secondary | ICD-10-CM | POA: Diagnosis not present

## 2019-01-15 NOTE — Progress Notes (Signed)
95 percentile pressure 12.7   95th percentile leak 8.3   apnea index 1.2 /hr  apnea-hypopnea index  1.7 /hr   total days used  >4 hr 30 days  total days used <4 hr 0 days  Total compliance 100 percent  He is doing great on cpap no problems or questions at this time

## 2019-01-16 ENCOUNTER — Telehealth: Payer: Self-pay

## 2019-01-16 DIAGNOSIS — G4733 Obstructive sleep apnea (adult) (pediatric): Secondary | ICD-10-CM | POA: Diagnosis not present

## 2019-01-16 NOTE — Telephone Encounter (Signed)
VERBAL ORDER FOR MEDICAL EQUIPMENT SIGNED AND PLACED IN [employer] PATIENT FOLDER. °

## 2019-01-20 DIAGNOSIS — M5030 Other cervical disc degeneration, unspecified cervical region: Secondary | ICD-10-CM | POA: Diagnosis not present

## 2019-01-20 DIAGNOSIS — M5412 Radiculopathy, cervical region: Secondary | ICD-10-CM | POA: Diagnosis not present

## 2019-01-30 ENCOUNTER — Telehealth: Payer: Self-pay

## 2019-01-30 NOTE — Telephone Encounter (Signed)
Confirmed appointment with patient. klh °

## 2019-02-03 ENCOUNTER — Encounter: Payer: Self-pay | Admitting: Internal Medicine

## 2019-02-03 ENCOUNTER — Other Ambulatory Visit: Payer: Self-pay

## 2019-02-03 ENCOUNTER — Ambulatory Visit: Payer: Medicare HMO | Admitting: Internal Medicine

## 2019-02-03 VITALS — BP 140/82 | HR 70 | Temp 97.4°F | Resp 16 | Ht 72.0 in | Wt 163.0 lb

## 2019-02-03 DIAGNOSIS — G4733 Obstructive sleep apnea (adult) (pediatric): Secondary | ICD-10-CM

## 2019-02-03 DIAGNOSIS — Z9989 Dependence on other enabling machines and devices: Secondary | ICD-10-CM

## 2019-02-03 DIAGNOSIS — I10 Essential (primary) hypertension: Secondary | ICD-10-CM | POA: Diagnosis not present

## 2019-02-03 DIAGNOSIS — I5189 Other ill-defined heart diseases: Secondary | ICD-10-CM | POA: Diagnosis not present

## 2019-02-03 NOTE — Progress Notes (Signed)
[facility] [address] [place], [place] [address]  Pulmonary Sleep Medicine   Office Visit Note  Patient Name: Michael Everett DOB: Oct 04, 1945 MRN [id number]  Date of Service: 02/03/2019  Complaints/HPI: Pt is here for cpap compliance.  He has been using his machine for just over one month.  He is doing very well.  His compliance report shows 100% compliance with an overall ahi of 1.7.  He denies any issues at this time.   ROS  General: (-) fever, (-) chills, (-) night sweats, (-) weakness Skin: (-) rashes, (-) itching,. Eyes: (-) visual changes, (-) redness, (-) itching. Nose and Sinuses: (-) nasal stuffiness or itchiness, (-) postnasal drip, (-) nosebleeds, (-) sinus trouble. Mouth and Throat: (-) sore throat, (-) hoarseness. Neck: (-) swollen glands, (-) enlarged thyroid, (-) neck pain. Respiratory: - cough, (-) bloody sputum, - shortness of breath, - wheezing. Cardiovascular: - ankle swelling, (-) chest pain. Lymphatic: (-) lymph node enlargement. Neurologic: (-) numbness, (-) tingling. Psychiatric: (-) anxiety, (-) depression   Current Medication: Outpatient Encounter Medications as of 02/03/2019  Medication Sig  . aspirin EC 81 MG tablet Take 81 mg by mouth daily.  Marland Kitchen atorvastatin (LIPITOR) 10 MG tablet TAKE 1 TABLET (10 MG TOTAL) BY MOUTH DAILY AT 6 PM.  . etodolac (LODINE) 400 MG tablet TAKE 1 TABLET (400 MG TOTAL) BY MOUTH 2 (TWO) TIMES DAILY AS NEEDED FOR MODERATE PAIN.  . fluticasone (FLONASE) 50 MCG/ACT nasal spray Place 1 spray into both nostrils daily.  Marland Kitchen lisinopril (ZESTRIL) 40 MG tablet TAKE 1 TABLET BY MOUTH EVERY DAY FOR BLOOD PRESSURE CONTROL  . sildenafil (REVATIO) 20 MG tablet Take 2-3 tabs po 1 hr prior  To desired sexual activity   No facility-administered encounter medications on file as of 02/03/2019.     Surgical History: Past Surgical History:  Procedure Laterality Date  . cataract and lens implant right eye  2015  . CATARACT  EXTRACTION, BILATERAL Bilateral 10/2013  . VASECTOMY      Medical History: Past Medical History:  Diagnosis Date  . Hyperlipidemia   . Hypertension     Family History: Family History  Problem Relation Age of Onset  . Hyperlipidemia Mother   . Heart disease Father     Social History: Social History   Socioeconomic History  . Marital status: Married    Spouse name: Not on file  . Number of children: Not on file  . Years of education: Not on file  . Highest education level: Not on file  Occupational History  . Not on file  Social Needs  . Financial resource strain: Not on file  . Food insecurity    Worry: Not on file    Inability: Not on file  . Transportation needs    Medical: Not on file    Non-medical: Not on file  Tobacco Use  . Smoking status: Never Smoker  . Smokeless tobacco: Never Used  Substance and Sexual Activity  . Alcohol use: No    Frequency: Never  . Drug use: No  . Sexual activity: Not on file  Lifestyle  . Physical activity    Days per week: Not on file    Minutes per session: Not on file  . Stress: Not on file  Relationships  . Social Musician on phone: Not on file    Gets together: Not on file    Attends religious service: Not on file    Active member of  club or organization: Not on file    Attends meetings of clubs or organizations: Not on file    Relationship status: Not on file  . Intimate partner violence    Fear of current or ex partner: Not on file    Emotionally abused: Not on file    Physically abused: Not on file    Forced sexual activity: Not on file  Other Topics Concern  . Not on file  Social History Narrative  . Not on file    Vital Signs: There were no vitals taken for this visit.  Examination: General Appearance: The patient is well-developed, well-nourished, and in no distress. Skin: Gross inspection of skin unremarkable. Head: normocephalic, no gross deformities. Eyes: no gross deformities  noted. ENT: ears appear grossly normal no exudates. Neck: Supple. No thyromegaly. No LAD. Respiratory: clear bilaterally. Cardiovascular: Normal S1 and S2 without murmur or rub. Extremities: No cyanosis. pulses are equal. Neurologic: Alert and oriented. No involuntary movements.  LABS: No results found for this or any previous visit (from the past 2160 hour(s)).  Radiology: Mr Cervical Spine Wo Contrast  Result Date: 10/27/2018 CLINICAL DATA:  Neck pain for 2 years radiating to both shoulders, right greater than left. EXAM: MRI CERVICAL SPINE WITHOUT CONTRAST TECHNIQUE: Multiplanar, multisequence MR imaging of the cervical spine was performed. No intravenous contrast was administered. COMPARISON:  None. FINDINGS: Alignment: Grade 1 retrolisthesis at C3-4. Vertebrae: There is fusion of the C5-6 levels anteriorly. Cord: Normal Posterior Fossa, vertebral arteries, paraspinal tissues: Visualized posterior fossa is normal. Vertebral artery flow voids are preserved. No prevertebral soft tissue swelling. Disc levels: C1-C2: Normal. C2-C3: Normal disc space and facets. No spinal canal or neuroforaminal stenosis. C3-C4: Large right uncovertebral osteophyte causes severe right neural foraminal stenosis. No central spinal canal stenosis. Mild left foraminal stenosis due to uncovertebral hypertrophy. C4-C5: Right-greater-than-left facet hypertrophy. Small disc bulge. No spinal canal stenosis. Mild right foraminal stenosis. C5-C6: Anterior fusion. Bilateral uncovertebral spurring, left-greater-than-right with mild right, moderate left neural foraminal stenosis. No spinal canal stenosis. C6-C7: Anterior fusion. No spinal canal or neural foraminal stenosis. C7-T1: Normal T1-2 and T2-3 are imaged only in the sagittal plane and are normal. IMPRESSION: 1. 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 2. Mild right, moderate left C6 neural foraminal stenosis. Electronically Signed   By: Ulyses Jarred M.D.   On: 10/27/2018 00:12    No results found.  No results found.    Assessment and Plan: Patient Active Problem List   Diagnosis Date Noted  . Diastolic dysfunction 23/76/2831  . Snoring 11/17/2018  . Special screening for malignant neoplasm of prostate 09/17/2017  . Need for vaccination against Streptococcus pneumoniae using pneumococcal conjugate vaccine 13 09/17/2017  . Dysuria 09/17/2017  . Hypertension 04/29/2017  . Hyperlipidemia 04/29/2017  . Cervicalgia 04/29/2017    1. OSA on CPAP Continue using CPAP as discussed.   2. Essential hypertension BP today initially low, on retake 140/82.  Continue present management.   3. Diastolic dysfunction On Echo, was likely due to osa, that is now treated. Continue to monitor.   General Counseling: I have discussed the findings of the evaluation and examination with Jaquelyn Bitter.  I have also discussed any further diagnostic evaluation thatmay be needed or ordered today. Zabian verbalizes understanding of the findings of todays visit. We also reviewed his medications today and discussed drug interactions and side effects including but not limited excessive drowsiness and altered mental states. We also discussed that there is  always a risk not just to him but also people around him. he has been encouraged to call the office with any questions or concerns that should arise related to todays visit.    Time spent: 15 This patient was seen by Blima LedgerAdam Brynli Ollis AGNP-C in Collaboration with Dr. Freda MunroSaadat Khan as a part of collaborative care agreement.   I have personally obtained a history, examined the patient, evaluated laboratory and imaging results, formulated the assessment and plan and placed orders.    Yevonne PaxSaadat A Khan, MD [facility] Pulmonary and Critical Care Sleep medicine

## 2019-02-04 ENCOUNTER — Ambulatory Visit: Payer: Medicare HMO | Admitting: Nurse Practitioner

## 2019-02-10 DIAGNOSIS — G4733 Obstructive sleep apnea (adult) (pediatric): Secondary | ICD-10-CM | POA: Diagnosis not present

## 2019-02-18 DIAGNOSIS — G4733 Obstructive sleep apnea (adult) (pediatric): Secondary | ICD-10-CM | POA: Diagnosis not present

## 2019-03-12 DIAGNOSIS — G4733 Obstructive sleep apnea (adult) (pediatric): Secondary | ICD-10-CM | POA: Diagnosis not present

## 2019-03-20 ENCOUNTER — Telehealth: Payer: Self-pay

## 2019-03-20 NOTE — Telephone Encounter (Signed)
CONFIRMED AND SCREENED FOR 03-25-19 OV. 

## 2019-03-24 ENCOUNTER — Ambulatory Visit: Payer: Medicare HMO | Admitting: Nurse Practitioner

## 2019-03-25 ENCOUNTER — Encounter: Payer: Self-pay | Admitting: Nurse Practitioner

## 2019-03-25 ENCOUNTER — Ambulatory Visit: Payer: Medicare HMO | Admitting: Nurse Practitioner

## 2019-03-25 ENCOUNTER — Other Ambulatory Visit: Payer: Self-pay

## 2019-03-25 VITALS — BP 142/90 | HR 51 | Resp 16 | Ht 72.0 in | Wt 165.0 lb

## 2019-03-25 DIAGNOSIS — G4733 Obstructive sleep apnea (adult) (pediatric): Secondary | ICD-10-CM

## 2019-03-25 DIAGNOSIS — R001 Bradycardia, unspecified: Secondary | ICD-10-CM

## 2019-03-25 DIAGNOSIS — I10 Essential (primary) hypertension: Secondary | ICD-10-CM

## 2019-03-25 DIAGNOSIS — Z9989 Dependence on other enabling machines and devices: Secondary | ICD-10-CM | POA: Diagnosis not present

## 2019-03-25 NOTE — Progress Notes (Signed)
[facility] [address] [place], [place] [address]  Internal MEDICINE  Office Visit Note  Patient Name: Michael Everett  [id number]  [id number]  Date of Service: 03/29/2019  Chief Complaint  Patient presents with  . Hypertension  . Hyperlipidemia  . Quality Metric Gaps    2nd pna vacc     Mr. Klugh presents for a routine follow-up. His BP is elevated. He states that he checks his blood pressure at home about once a month and that it usually runs in the 140's systolic. He has had routine, fasting labs done 09/2018 and they were good. Platelet count was midly low at 147, and there were no other abnormalties.     Current Medication: Outpatient Encounter Medications as of 03/25/2019  Medication Sig  . aspirin EC 81 MG tablet Take 81 mg by mouth daily.  Marland Kitchen atorvastatin (LIPITOR) 10 MG tablet TAKE 1 TABLET (10 MG TOTAL) BY MOUTH DAILY AT 6 PM.  . etodolac (LODINE) 400 MG tablet TAKE 1 TABLET (400 MG TOTAL) BY MOUTH 2 (TWO) TIMES DAILY AS NEEDED FOR MODERATE PAIN.  . fluticasone (FLONASE) 50 MCG/ACT nasal spray Place 1 spray into both nostrils daily.  Marland Kitchen lisinopril (ZESTRIL) 40 MG tablet TAKE 1 TABLET BY MOUTH EVERY DAY FOR BLOOD PRESSURE CONTROL  . sildenafil (REVATIO) 20 MG tablet Take 2-3 tabs po 1 hr prior  To desired sexual activity   No facility-administered encounter medications on file as of 03/25/2019.    Surgical History: Past Surgical History:  Procedure Laterality Date  . cataract and lens implant right eye  2015  . CATARACT EXTRACTION, BILATERAL Bilateral 10/2013  . VASECTOMY      Medical History: Past Medical History:  Diagnosis Date  . Hyperlipidemia   . Hypertension     Family History: Family History  Problem Relation Age of Onset  . Hyperlipidemia Mother   . Heart disease Father       Review of Systems  Constitutional: Negative for appetite change, chills, fatigue, fever and unexpected weight change.  HENT: Negative for congestion,  rhinorrhea, sinus pressure, sinus pain and sore throat.   Respiratory: Negative for cough, shortness of breath and wheezing.   Cardiovascular: Negative for chest pain and palpitations.  Gastrointestinal: Negative for abdominal pain, constipation, diarrhea, nausea and vomiting.  Endocrine: Negative for cold intolerance, heat intolerance, polydipsia and polyuria.  Musculoskeletal: Positive for back pain and neck pain.       Chronic neck pain, last steroid injection in November 2020. Pain is manageable, still able to complete ADLs  Skin: Negative for rash and wound.  Allergic/Immunologic: Negative for environmental allergies.  Neurological: Negative for dizziness, light-headedness and headaches.  Psychiatric/Behavioral: Negative for dysphoric mood and sleep disturbance. The patient is not nervous/anxious.      Today's Vitals   03/25/19 1342 03/25/19 1410  BP: (!) 180/88 (!) 142/90  Pulse: (!) 51   Resp: 16   SpO2: 98%   Weight: 165 lb (74.8 kg)   Height: 6' (1.829 m)    Body mass index is 22.38 kg/m.  Physical Exam Vitals reviewed.  Constitutional:      General: He is not in acute distress.    Appearance: Normal appearance. He is normal weight.  HENT:     Head: Normocephalic and atraumatic.     Nose: Nose normal.  Eyes:     Pupils: Pupils are equal, round, and reactive to light.  Neck:     Vascular: No carotid bruit.  Cardiovascular:  Rate and Rhythm: Regular rhythm. Bradycardia present.     Pulses: Normal pulses.     Heart sounds: Normal heart sounds.  Pulmonary:     Effort: Pulmonary effort is normal.     Breath sounds: Normal breath sounds.  Abdominal:     Palpations: Abdomen is soft.  Musculoskeletal:        General: Normal range of motion.     Cervical back: Neck supple. Pain with movement present.  Skin:    General: Skin is warm and dry.     Capillary Refill: Capillary refill takes 2 to 3 seconds.  Neurological:     Mental Status: He is alert and oriented  to person, place, and time.     Gait: Gait normal.  Psychiatric:        Mood and Affect: Mood normal.        Behavior: Behavior normal.        Thought Content: Thought content normal.        Judgment: Judgment normal.   Assessment/Plan: 1. Essential hypertension Stable. Continue bp medication as prescribed   2. Bradycardia Baseline for patient.   3. OSA on CPAP Continue regular visits with Dr. Devona Konig for CPAP management.    General Counseling: guido comp understanding of the findings of todays visit and agrees with plan of treatment. I have discussed any further diagnostic evaluation that may be needed or ordered today. We also reviewed his medications today. he has been encouraged to call the office with any questions or concerns that should arise related to todays visit.    Counseling:   Hypertension Counseling:   The following hypertensive lifestyle modification were recommended and discussed:  1. Limiting alcohol intake to less than 1 oz/day of ethanol:(24 oz of beer or 8 oz of wine or 2 oz of 100-proof whiskey). 2. Take baby ASA 81 mg daily. 3. Importance of regular aerobic exercise and losing weight. 4. Reduce dietary saturated fat and cholesterol intake for overall cardiovascular health. 5. Maintaining adequate dietary potassium, calcium, and magnesium intake. 6. Regular monitoring of the blood pressure. 7. Reduce sodium intake to less than 100 mmol/day (less than 2.3 gm of sodium or less than 6 gm of sodium choride)   This patient was seen by [place] with Dr Lavera Guise as a part of collaborative care agreement  Time spent:20 [place], MD  Internal Medicine

## 2019-03-29 DIAGNOSIS — G4733 Obstructive sleep apnea (adult) (pediatric): Secondary | ICD-10-CM | POA: Insufficient documentation

## 2019-03-29 DIAGNOSIS — Z9989 Dependence on other enabling machines and devices: Secondary | ICD-10-CM | POA: Insufficient documentation

## 2019-03-29 DIAGNOSIS — R001 Bradycardia, unspecified: Secondary | ICD-10-CM | POA: Insufficient documentation

## 2019-05-06 ENCOUNTER — Telehealth: Payer: Self-pay

## 2019-05-06 NOTE — Telephone Encounter (Signed)
CONFIRMATION OF VERBAL ORDER SIGNED AND PLACED IN [employer] PATIENT FOLDER. °

## 2019-05-10 ENCOUNTER — Ambulatory Visit: Payer: Medicare HMO | Attending: Internal Medicine

## 2019-05-10 DIAGNOSIS — Z23 Encounter for immunization: Secondary | ICD-10-CM | POA: Insufficient documentation

## 2019-05-10 NOTE — Progress Notes (Signed)
   Covid-19 Vaccination Clinic  Name:  Michael Everett    MRN: [id number] DOB: 06/25/1945  05/10/2019  [name] was observed post Covid-19 immunization for 15 minutes without incidence. He was provided with Vaccine Information Sheet and instruction to access the V-Safe system.   [name] was instructed to call 911 with any severe reactions post vaccine: Marland Kitchen Difficulty breathing  . Swelling of your face and throat  . A fast heartbeat  . A bad rash all over your body  . Dizziness and weakness    Immunizations Administered    Name Date Dose VIS Date Route   Pfizer COVID-19 Vaccine 05/10/2019 10:41 AM 0.3 mL 02/28/2019 Intramuscular   Manufacturer: [employer], [employer]   Lot: [id number]   NDC: [address]-8

## 2019-06-02 ENCOUNTER — Ambulatory Visit: Payer: Medicare HMO | Attending: Internal Medicine

## 2019-06-02 DIAGNOSIS — Z23 Encounter for immunization: Secondary | ICD-10-CM

## 2019-06-02 NOTE — Progress Notes (Signed)
   Covid-19 Vaccination Clinic  Name:  Michael Everett    MRN: [id number] DOB: [birth year]  06/02/2019  [name] was observed post Covid-19 immunization for 15 minutes without incident. He was provided with Vaccine Information Sheet and instruction to access the V-Safe system.   [name] was instructed to call 911 with any severe reactions post vaccine: Marland Kitchen Difficulty breathing  . Swelling of face and throat  . A fast heartbeat  . A bad rash all over body  . Dizziness and weakness   Immunizations Administered    Name Date Dose VIS Date Route   Pfizer COVID-19 Vaccine 06/02/2019  9:14 AM 0.3 mL 02/28/2019 Intramuscular   Manufacturer: [employer], [employer]   Lot: [id number]   NDC: [address]-3

## 2019-07-16 ENCOUNTER — Other Ambulatory Visit: Payer: Self-pay

## 2019-07-16 ENCOUNTER — Ambulatory Visit: Payer: Medicare HMO

## 2019-07-16 DIAGNOSIS — G4733 Obstructive sleep apnea (adult) (pediatric): Secondary | ICD-10-CM | POA: Diagnosis not present

## 2019-07-16 NOTE — Progress Notes (Signed)
95 percentile pressure 13   95th percentile leak 4.7   apnea index 1.3 /hr  apnea-hypopnea index  1.8 /hr   total days used  >4 hr 90 days  total days used <4 hr 0 days  Total compliance 100 percent  He is going great no problems or questions at this time

## 2019-07-27 ENCOUNTER — Other Ambulatory Visit: Payer: Self-pay | Admitting: Internal Medicine

## 2019-07-27 DIAGNOSIS — M542 Cervicalgia: Secondary | ICD-10-CM

## 2019-07-31 ENCOUNTER — Telehealth: Payer: Self-pay

## 2019-07-31 NOTE — Telephone Encounter (Signed)
Confirmed appointment on 08/04/2019 and screened for covid. klh 

## 2019-08-04 ENCOUNTER — Ambulatory Visit: Payer: Medicare HMO | Admitting: Internal Medicine

## 2019-08-04 ENCOUNTER — Encounter: Payer: Self-pay | Admitting: Internal Medicine

## 2019-08-04 ENCOUNTER — Other Ambulatory Visit: Payer: Self-pay

## 2019-08-04 VITALS — BP 130/88 | HR 55 | Temp 97.4°F | Resp 16 | Ht 72.0 in | Wt 163.4 lb

## 2019-08-04 DIAGNOSIS — Z9989 Dependence on other enabling machines and devices: Secondary | ICD-10-CM

## 2019-08-04 DIAGNOSIS — I10 Essential (primary) hypertension: Secondary | ICD-10-CM

## 2019-08-04 DIAGNOSIS — G4733 Obstructive sleep apnea (adult) (pediatric): Secondary | ICD-10-CM

## 2019-08-04 NOTE — Progress Notes (Signed)
[facility] [address] [place], [place] [address]  Pulmonary Sleep Medicine   Office Visit Note  Patient Name: Michael Everett DOB: 03-29-1945 MRN [id number]  Date of Service: 08/04/2019  Complaints/HPI: Pt is here for cpap compliance.  Pt reports good compliance with CPAP therapy. Cleaning machine by hand, and changing filters and tubing as directed. Denies headaches, sinus issues, palpitations, or hemoptysis.  Last download shows 100% compliance with an overall ahi of 1.8 hr.    ROS  General: (-) fever, (-) chills, (-) night sweats, (-) weakness Skin: (-) rashes, (-) itching,. Eyes: (-) visual changes, (-) redness, (-) itching. Nose and Sinuses: (-) nasal stuffiness or itchiness, (-) postnasal drip, (-) nosebleeds, (-) sinus trouble. Mouth and Throat: (-) sore throat, (-) hoarseness. Neck: (-) swollen glands, (-) enlarged thyroid, (-) neck pain. Respiratory: - cough, (-) bloody sputum, - shortness of breath, - wheezing. Cardiovascular: - ankle swelling, (-) chest pain. Lymphatic: (-) lymph node enlargement. Neurologic: (-) numbness, (-) tingling. Psychiatric: (-) anxiety, (-) depression   Current Medication: Outpatient Encounter Medications as of 08/04/2019  Medication Sig  . aspirin EC 81 MG tablet Take 81 mg by mouth daily.  Marland Kitchen atorvastatin (LIPITOR) 10 MG tablet TAKE 1 TABLET (10 MG TOTAL) BY MOUTH DAILY AT 6 PM.  . etodolac (LODINE) 400 MG tablet TAKE 1 TABLET (400 MG TOTAL) BY MOUTH 2 (TWO) TIMES DAILY AS NEEDED FOR MODERATE PAIN.  . fluticasone (FLONASE) 50 MCG/ACT nasal spray Place 1 spray into both nostrils daily.  Marland Kitchen lisinopril (ZESTRIL) 40 MG tablet TAKE 1 TABLET BY MOUTH EVERY DAY FOR BLOOD PRESSURE CONTROL  . sildenafil (REVATIO) 20 MG tablet Take 2-3 tabs po 1 hr prior  To desired sexual activity  . [DISCONTINUED] etodolac (LODINE) 400 MG tablet TAKE 1 TABLET (400 MG TOTAL) BY MOUTH 2 (TWO) TIMES DAILY AS NEEDED FOR MODERATE PAIN.   No  facility-administered encounter medications on file as of 08/04/2019.    Surgical History: Past Surgical History:  Procedure Laterality Date  . cataract and lens implant right eye  2015  . CATARACT EXTRACTION, BILATERAL Bilateral 10/2013  . VASECTOMY      Medical History: Past Medical History:  Diagnosis Date  . Hyperlipidemia   . Hypertension     Family History: Family History  Problem Relation Age of Onset  . Hyperlipidemia Mother   . Heart disease Father     Social History: Social History   Socioeconomic History  . Marital status: Married    Spouse name: Not on file  . Number of children: Not on file  . Years of education: Not on file  . Highest education level: Not on file  Occupational History  . Not on file  Tobacco Use  . Smoking status: Former Smoker    Types: Cigarettes  . Smokeless tobacco: Never Used  Substance and Sexual Activity  . Alcohol use: No  . Drug use: No  . Sexual activity: Not on file  Other Topics Concern  . Not on file  Social History Narrative  . Not on file   Social Determinants of Health   Financial Resource Strain:   . Difficulty of Paying Living Expenses:   Food Insecurity:   . Worried About Programme researcher, broadcasting/film/video in the Last Year:   . Barista in the Last Year:   Transportation Needs:   . Freight forwarder (Medical):   Marland Kitchen Lack of Transportation (Non-Medical):   Physical Activity:   .  Days of Exercise per Week:   . Minutes of Exercise per Session:   Stress:   . Feeling of Stress :   Social Connections:   . Frequency of Communication with Friends and Family:   . Frequency of Social Gatherings with Friends and Family:   . Attends Religious Services:   . Active Member of Clubs or Organizations:   . Attends Archivist Meetings:   Marland Kitchen Marital Status:   Intimate Partner Violence:   . Fear of Current or Ex-Partner:   . Emotionally Abused:   Marland Kitchen Physically Abused:   . Sexually Abused:     Vital  Signs: Blood pressure (!) 160/85, pulse (!) 55, temperature (!) 97.4 F (36.3 C), resp. rate 16, height 6' (1.829 m), weight 163 lb 6.4 oz (74.1 kg), SpO2 100 %.  Examination: General Appearance: The patient is well-developed, well-nourished, and in no distress. Skin: Gross inspection of skin unremarkable. Head: normocephalic, no gross deformities. Eyes: no gross deformities noted. ENT: ears appear grossly normal no exudates. Neck: Supple. No thyromegaly. No LAD. Respiratory: clear bilaterally. Cardiovascular: Normal S1 and S2 without murmur or rub. Extremities: No cyanosis. pulses are equal. Neurologic: Alert and oriented. No involuntary movements.  LABS: No results found for this or any previous visit (from the past 2160 hour(s)).  Radiology: MR CERVICAL SPINE WO CONTRAST  Result Date: 10/27/2018 CLINICAL DATA:  Neck pain for 2 years radiating to both shoulders, right greater than left. EXAM: MRI CERVICAL SPINE WITHOUT CONTRAST TECHNIQUE: Multiplanar, multisequence MR imaging of the cervical spine was performed. No intravenous contrast was administered. COMPARISON:  None. FINDINGS: Alignment: Grade 1 retrolisthesis at C3-4. Vertebrae: There is fusion of the C5-6 levels anteriorly. Cord: Normal Posterior Fossa, vertebral arteries, paraspinal tissues: Visualized posterior fossa is normal. Vertebral artery flow voids are preserved. No prevertebral soft tissue swelling. Disc levels: C1-C2: Normal. C2-C3: Normal disc space and facets. No spinal canal or neuroforaminal stenosis. C3-C4: Large right uncovertebral osteophyte causes severe right neural foraminal stenosis. No central spinal canal stenosis. Mild left foraminal stenosis due to uncovertebral hypertrophy. C4-C5: Right-greater-than-left facet hypertrophy. Small disc bulge. No spinal canal stenosis. Mild right foraminal stenosis. C5-C6: Anterior fusion. Bilateral uncovertebral spurring, left-greater-than-right with mild right, moderate left  neural foraminal stenosis. No spinal canal stenosis. C6-C7: Anterior fusion. No spinal canal or neural foraminal stenosis. C7-T1: Normal T1-2 and T2-3 are imaged only in the sagittal plane and are normal. IMPRESSION: 1. 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 2. Mild right, moderate left C6 neural foraminal stenosis. Electronically Signed   By: Ulyses Jarred M.D.   On: 10/27/2018 00:12    No results found.  No results found.    Assessment and Plan: Patient Active Problem List   Diagnosis Date Noted  . Bradycardia 03/29/2019  . OSA on CPAP 03/29/2019  . Diastolic dysfunction 69/62/9528  . Snoring 11/17/2018  . Special screening for malignant neoplasm of prostate 09/17/2017  . Need for vaccination against Streptococcus pneumoniae using pneumococcal conjugate vaccine 13 09/17/2017  . Dysuria 09/17/2017  . Hypertension 04/29/2017  . Hyperlipidemia 04/29/2017  . Cervicalgia 04/29/2017    1. OSA on CPAP Good control of symptoms, and excellent compliance.  Continue as before.   2. Essential hypertension Stable, continue to monitor.      General Counseling: I have discussed the findings of the evaluation and examination with Michael Everett.  I have also discussed any further diagnostic evaluation thatmay be needed or ordered today. Michael Everett verbalizes understanding of  the findings of todays visit. We also reviewed his medications today and discussed drug interactions and side effects including but not limited excessive drowsiness and altered mental states. We also discussed that there is always a risk not just to him but also people around him. he has been encouraged to call the office with any questions or concerns that should arise related to todays visit.  No orders of the defined types were placed in this encounter.    Time spent: 30 This patient was seen by Blima Ledger AGNP-C in Collaboration with Dr. Freda Munro as a part of collaborative care  agreement.  I have personally obtained a history, examined the patient, evaluated laboratory and imaging results, formulated the assessment and plan and placed orders.    Yevonne Pax, MD [facility] Pulmonary and Critical Care Sleep medicine

## 2019-08-14 ENCOUNTER — Other Ambulatory Visit: Payer: Self-pay | Admitting: Adult Health

## 2019-09-20 ENCOUNTER — Other Ambulatory Visit: Payer: Self-pay | Admitting: Adult Health

## 2019-09-25 ENCOUNTER — Telehealth: Payer: Self-pay

## 2019-09-25 NOTE — Telephone Encounter (Signed)
Confirmed and screened for 09-29-19 ov. 

## 2019-09-29 ENCOUNTER — Encounter: Payer: Self-pay | Admitting: Nurse Practitioner

## 2019-09-29 ENCOUNTER — Other Ambulatory Visit: Payer: Self-pay

## 2019-09-29 ENCOUNTER — Other Ambulatory Visit: Payer: Self-pay | Admitting: Nurse Practitioner

## 2019-09-29 ENCOUNTER — Ambulatory Visit (INDEPENDENT_AMBULATORY_CARE_PROVIDER_SITE_OTHER): Payer: Medicare HMO | Admitting: Nurse Practitioner

## 2019-09-29 VITALS — BP 138/84 | HR 53 | Temp 97.8°F | Resp 16 | Ht 72.0 in | Wt 161.2 lb

## 2019-09-29 DIAGNOSIS — Z0001 Encounter for general adult medical examination with abnormal findings: Secondary | ICD-10-CM

## 2019-09-29 DIAGNOSIS — R300 Dysuria: Secondary | ICD-10-CM

## 2019-09-29 DIAGNOSIS — Z23 Encounter for immunization: Secondary | ICD-10-CM

## 2019-09-29 DIAGNOSIS — E782 Mixed hyperlipidemia: Secondary | ICD-10-CM

## 2019-09-29 DIAGNOSIS — I10 Essential (primary) hypertension: Secondary | ICD-10-CM

## 2019-09-29 MED ORDER — PNEUMOVAX 23 25 MCG/0.5ML IJ INJ: INJECTION | INTRAMUSCULAR | 0 refills | Status: DC

## 2019-09-29 NOTE — Progress Notes (Signed)
[facility] [address] [place], [place] [address]  Internal MEDICINE  Office Visit Note  Patient Name: Michael Everett  [id number]  [id number]  Date of Service: 09/29/2019   Pt is here for routine health maintenance examination  Chief Complaint  Patient presents with  . Medicare Wellness  . Hyperlipidemia  . Hypertension  . Quality Metric Gaps    TDAP, PNA Vaccine     The patient is here for health maintenance exam. Blood pressure slightly elevated upon arrival, however returns to normal levels by end of visit. He is due to have routine, fasting labs done. He is also due to have second pneumonia vaccine which will be sent to his pharmacy for administration. He has no new concerns or complaints today.     Current Medication: Outpatient Encounter Medications as of 09/29/2019  Medication Sig  . aspirin EC 81 MG tablet Take 81 mg by mouth daily.  Marland Kitchen atorvastatin (LIPITOR) 10 MG tablet TAKE 1 TABLET (10 MG TOTAL) BY MOUTH DAILY AT 6 PM.  . fluticasone (FLONASE) 50 MCG/ACT nasal spray PLACE 1 SPRAY INTO BOTH NOSTRILS DAILY.  Marland Kitchen lisinopril (ZESTRIL) 40 MG tablet TAKE 1 TABLET BY MOUTH EVERY DAY FOR BLOOD PRESSURE CONTROL  . [DISCONTINUED] etodolac (LODINE) 400 MG tablet TAKE 1 TABLET (400 MG TOTAL) BY MOUTH 2 (TWO) TIMES DAILY AS NEEDED FOR MODERATE PAIN.  . [DISCONTINUED] sildenafil (REVATIO) 20 MG tablet Take 2-3 tabs po 1 hr prior  To desired sexual activity  . pneumococcal 23 valent vaccine (PNEUMOVAX 23) 25 MCG/0.5ML injection Inject 0.63ml IM once   No facility-administered encounter medications on file as of 09/29/2019.    Surgical History: Past Surgical History:  Procedure Laterality Date  . cataract and lens implant right eye  2015  . CATARACT EXTRACTION, BILATERAL Bilateral 10/2013  . VASECTOMY      Medical History: Past Medical History:  Diagnosis Date  . Hyperlipidemia   . Hypertension     Family History: Family History  Problem Relation Age  of Onset  . Hyperlipidemia Mother   . Heart disease Father       Review of Systems  Constitutional: Negative for activity change, chills, fatigue and unexpected weight change.  HENT: Negative for congestion, postnasal drip, rhinorrhea, sneezing and sore throat.   Respiratory: Negative for cough, chest tightness, shortness of breath and wheezing.   Cardiovascular: Negative for chest pain and palpitations.       Mildly elevated blood pressure at the start of the visit today. Did return to normal levels by the end of visit.   Gastrointestinal: Negative for abdominal pain, constipation, diarrhea, nausea and vomiting.  Endocrine: Negative for cold intolerance, heat intolerance, polydipsia and polyuria.  Genitourinary: Negative for dysuria, frequency and urgency.  Musculoskeletal: Negative for arthralgias, back pain, joint swelling, neck pain and neck stiffness.  Skin: Negative for rash.  Allergic/Immunologic: Negative for environmental allergies.  Neurological: Negative for dizziness, tremors and numbness.  Hematological: Negative for adenopathy. Does not bruise/bleed easily.  Psychiatric/Behavioral: Negative for behavioral problems (Depression), sleep disturbance and suicidal ideas. The patient is not nervous/anxious.      Today's Vitals   09/29/19 0857  BP: 138/84  Pulse: (!) 53  Resp: 16  Temp: 97.8 F (36.6 C)  SpO2: 100%  Weight: 161 lb 3.2 oz (73.1 kg)  Height: 6' (1.829 m)   Body mass index is 21.86 kg/m.  Physical Exam Vitals and nursing note reviewed.  Constitutional:      General: He  is not in acute distress.    Appearance: Normal appearance. He is normal weight.  HENT:     Head: Normocephalic and atraumatic.     Nose: Nose normal.  Eyes:     Pupils: Pupils are equal, round, and reactive to light.  Neck:     Vascular: No carotid bruit.  Cardiovascular:     Rate and Rhythm: Regular rhythm. Bradycardia present.     Pulses: Normal pulses.     Heart sounds:  Normal heart sounds.  Pulmonary:     Effort: Pulmonary effort is normal.     Breath sounds: Normal breath sounds.  Abdominal:     General: Bowel sounds are normal.     Palpations: Abdomen is soft.     Tenderness: There is no abdominal tenderness.  Musculoskeletal:        General: Normal range of motion.     Cervical back: Neck supple. Pain with movement present.  Skin:    General: Skin is warm and dry.     Capillary Refill: Capillary refill takes 2 to 3 seconds.  Neurological:     General: No focal deficit present.     Mental Status: He is alert and oriented to person, place, and time.     Gait: Gait normal.  Psychiatric:        Mood and Affect: Mood normal.        Behavior: Behavior normal.        Thought Content: Thought content normal.        Judgment: Judgment normal.    Depression screen [facility] 2/9 09/29/2019 03/25/2019 09/25/2018 03/22/2018 09/17/2017  Decreased Interest 0 0 0 0 0  Down, Depressed, Hopeless 0 0 0 0 0  PHQ - 2 Score 0 0 0 0 0    Functional Status Survey: Is the patient deaf or have difficulty hearing?: No Does the patient have difficulty seeing, even when wearing glasses/contacts?: Yes Does the patient have difficulty concentrating, remembering, or making decisions?: No Does the patient have difficulty walking or climbing stairs?: No Does the patient have difficulty dressing or bathing?: No Does the patient have difficulty doing errands alone such as visiting a doctor's office or shopping?: No  MMSE - Mini Mental State Exam 09/29/2019 09/25/2018 09/17/2017  Orientation to time 5 5 5   Orientation to Place 5 5 5   Registration 3 3 3   Attention/ Calculation 5 5 5   Recall 3 3 3   Language- name 2 objects 2 2 2   Language- repeat 1 1 1   Language- follow 3 step command 3 3 3   Language- read & follow direction 1 1 1   Write a sentence 1 1 1   Copy design 1 1 1   Total score 30 30 30     Fall Risk  09/29/2019 03/25/2019 09/25/2018 03/22/2018 09/17/2017  Falls in the past year? 0 0  0 0 No  Number falls in past yr: - - 0 - -  Injury with Fall? - - 0 - -     Assessment/Plan: 1. Encounter for general adult medical examination with abnormal findings Annual health maintenance exam today. Orders given to have routine, fasting labs drawn.   2. Essential hypertension Stable. Continue bp medication as prescribed   3. Mixed hyperlipidemia Check fasting lipid panel and adjust atorvastatin dosing as indicated   4. Need for vaccination against Streptococcus pneumoniae using pneumococcal conjugate vaccine 13 Prescription for pneumovax sent to his pharmacy for administration.  - pneumococcal 23 valent vaccine (PNEUMOVAX 23) 25 MCG/0.5ML injection;  Inject 0.20ml IM once  Dispense: 0.5 mL; Refill: 0  5. Dysuria - Urinalysis, Routine w reflex microscopic  General Counseling: benjamim harnish understanding of the findings of todays visit and agrees with plan of treatment. I have discussed any further diagnostic evaluation that may be needed or ordered today. We also reviewed his medications today. he has been encouraged to call the office with any questions or concerns that should arise related to todays visit.    Counseling:  This patient was seen by Vincent Gros FNP Collaboration with Dr Lyndon Code as a part of collaborative care agreement  Orders Placed This Encounter  Procedures  . Urinalysis, Routine w reflex microscopic    Meds ordered this encounter  Medications  . pneumococcal 23 valent vaccine (PNEUMOVAX 23) 25 MCG/0.5ML injection    Sig: Inject 0.43ml IM once    Dispense:  0.5 mL    Refill:  0    Order Specific Question:   Supervising Provider    Answer:   Lyndon Code [[id number]]    Total time spent: 30 Minutes  Time spent includes review of chart, medications, test results, and follow up plan with the patient.     Lyndon Code, MD  Internal Medicine

## 2019-09-30 LAB — MICROSCOPIC EXAMINATION
Bacteria, UA: NONE SEEN
Casts: NONE SEEN /lpf
Epithelial Cells (non renal): NONE SEEN /hpf (ref 0–10)

## 2019-09-30 LAB — URINALYSIS, ROUTINE W REFLEX MICROSCOPIC
Bilirubin, UA: NEGATIVE
Glucose, UA: NEGATIVE
Ketones, UA: NEGATIVE
Leukocytes,UA: NEGATIVE
Nitrite, UA: NEGATIVE
Protein,UA: NEGATIVE
Specific Gravity, UA: 1.02 (ref 1.005–1.030)
Urobilinogen, Ur: 1 mg/dL (ref 0.2–1.0)
pH, UA: 6 (ref 5.0–7.5)

## 2019-09-30 LAB — COMPREHENSIVE METABOLIC PANEL
ALT: 22 IU/L (ref 0–44)
AST: 20 IU/L (ref 0–40)
Albumin/Globulin Ratio: 1.6 (ref 1.2–2.2)
Albumin: 4.5 g/dL (ref 3.7–4.7)
Alkaline Phosphatase: 86 IU/L (ref 48–121)
BUN/Creatinine Ratio: 22 (ref 10–24)
BUN: 22 mg/dL (ref 8–27)
Bilirubin Total: 1.1 mg/dL (ref 0.0–1.2)
CO2: 25 mmol/L (ref 20–29)
Calcium: 9.8 mg/dL (ref 8.6–10.2)
Chloride: 103 mmol/L (ref 96–106)
Creatinine, Ser: 1.01 mg/dL (ref 0.76–1.27)
GFR calc Af Amer: 83 mL/min/{1.73_m2} (ref >59)
GFR calc non Af Amer: 72 mL/min/{1.73_m2} (ref >59)
Globulin, Total: 2.8 g/dL (ref 1.5–4.5)
Glucose: 95 mg/dL (ref 65–99)
Potassium: 4.3 mmol/L (ref 3.5–5.2)
Sodium: 140 mmol/L (ref 134–144)
Total Protein: 7.3 g/dL (ref 6.0–8.5)

## 2019-09-30 LAB — LIPID PANEL WITH LDL/HDL RATIO
Cholesterol, Total: 133 mg/dL (ref 100–199)
HDL: 53 mg/dL (ref >39)
LDL Chol Calc (NIH): 65 mg/dL (ref 0–99)
LDL/HDL Ratio: 1.2 ratio (ref 0.0–3.6)
Triglycerides: 76 mg/dL (ref 0–149)
VLDL Cholesterol Cal: 15 mg/dL (ref 5–40)

## 2019-09-30 LAB — CBC
Hematocrit: 44.6 % (ref 37.5–51.0)
Hemoglobin: 15.6 g/dL (ref 13.0–17.7)
MCH: 31.4 pg (ref 26.6–33.0)
MCHC: 35 g/dL (ref 31.5–35.7)
MCV: 90 fL (ref 79–97)
Platelets: 159 10*3/uL (ref 150–450)
RBC: 4.97 x10E6/uL (ref 4.14–5.80)
RDW: 13.5 % (ref 11.6–15.4)
WBC: 5.4 10*3/uL (ref 3.4–10.8)

## 2019-09-30 LAB — TSH: TSH: 2.46 u[IU]/mL (ref 0.450–4.500)

## 2019-09-30 LAB — T4: T4, Total: 7.3 ug/dL (ref 4.5–12.0)

## 2019-09-30 LAB — HEPATITIS C ANTIBODY: Hep C Virus Ab: <0.1 s/co ratio (ref 0.0–0.9)

## 2019-09-30 LAB — PSA: Prostate Specific Ag, Serum: 3.3 ng/mL (ref 0.0–4.0)

## 2019-10-05 NOTE — Progress Notes (Signed)
Reviewed with patient during visit. All labs look good.

## 2019-10-21 ENCOUNTER — Other Ambulatory Visit: Payer: Self-pay

## 2019-10-21 ENCOUNTER — Encounter: Payer: Self-pay | Admitting: Adult Health

## 2019-10-21 ENCOUNTER — Encounter: Payer: Self-pay | Admitting: Emergency Medicine

## 2019-10-21 ENCOUNTER — Ambulatory Visit (INDEPENDENT_AMBULATORY_CARE_PROVIDER_SITE_OTHER): Payer: Medicare HMO | Admitting: Adult Health

## 2019-10-21 ENCOUNTER — Ambulatory Visit
Admission: RE | Admit: 2019-10-21 | Discharge: 2019-10-21 | Disposition: A | Discharge status: Home / Self Care | Payer: Medicare HMO | Source: Ambulatory Visit | Attending: Adult Health | Admitting: Adult Health

## 2019-10-21 VITALS — BP 160/88 | HR 62 | Temp 97.9°F | Resp 16 | Ht 72.0 in | Wt 161.2 lb

## 2019-10-21 DIAGNOSIS — N132 Hydronephrosis with renal and ureteral calculous obstruction: Secondary | ICD-10-CM | POA: Diagnosis not present

## 2019-10-21 DIAGNOSIS — R1031 Right lower quadrant pain: Secondary | ICD-10-CM

## 2019-10-21 DIAGNOSIS — Z7982 Long term (current) use of aspirin: Secondary | ICD-10-CM | POA: Insufficient documentation

## 2019-10-21 DIAGNOSIS — I10 Essential (primary) hypertension: Secondary | ICD-10-CM

## 2019-10-21 DIAGNOSIS — R300 Dysuria: Secondary | ICD-10-CM

## 2019-10-21 DIAGNOSIS — K5730 Diverticulosis of large intestine without perforation or abscess without bleeding: Secondary | ICD-10-CM | POA: Insufficient documentation

## 2019-10-21 DIAGNOSIS — I700 Atherosclerosis of aorta: Secondary | ICD-10-CM | POA: Insufficient documentation

## 2019-10-21 DIAGNOSIS — Z79899 Other long term (current) drug therapy: Secondary | ICD-10-CM | POA: Diagnosis not present

## 2019-10-21 DIAGNOSIS — N134 Hydroureter: Secondary | ICD-10-CM | POA: Diagnosis not present

## 2019-10-21 DIAGNOSIS — Z87891 Personal history of nicotine dependence: Secondary | ICD-10-CM | POA: Insufficient documentation

## 2019-10-21 LAB — CBC
HCT: 44.9 % (ref 39.0–52.0)
Hemoglobin: 15.9 g/dL (ref 13.0–17.0)
MCH: 30.9 pg (ref 26.0–34.0)
MCHC: 35.4 g/dL (ref 30.0–36.0)
MCV: 87.2 fL (ref 80.0–100.0)
Platelets: 152 10*3/uL (ref 150–400)
RBC: 5.15 MIL/uL (ref 4.22–5.81)
RDW: 13.4 % (ref 11.5–15.5)
WBC: 11 10*3/uL — ABNORMAL HIGH (ref 4.0–10.5)
nRBC: 0 % (ref 0.0–0.2)

## 2019-10-21 LAB — COMPREHENSIVE METABOLIC PANEL
ALT: 24 U/L (ref 0–44)
AST: 25 U/L (ref 15–41)
Albumin: 4.3 g/dL (ref 3.5–5.0)
Alkaline Phosphatase: 68 U/L (ref 38–126)
Anion gap: 11 (ref 5–15)
BUN: 24 mg/dL — ABNORMAL HIGH (ref 8–23)
CO2: 27 mmol/L (ref 22–32)
Calcium: 9.4 mg/dL (ref 8.9–10.3)
Chloride: 101 mmol/L (ref 98–111)
Creatinine, Ser: 1.42 mg/dL — ABNORMAL HIGH (ref 0.61–1.24)
GFR calc Af Amer: 55 mL/min — ABNORMAL LOW (ref >60)
GFR calc non Af Amer: 48 mL/min — ABNORMAL LOW (ref >60)
Glucose, Bld: 124 mg/dL — ABNORMAL HIGH (ref 70–99)
Potassium: 4.3 mmol/L (ref 3.5–5.1)
Sodium: 139 mmol/L (ref 135–145)
Total Bilirubin: 1.5 mg/dL — ABNORMAL HIGH (ref 0.3–1.2)
Total Protein: 7.6 g/dL (ref 6.5–8.1)

## 2019-10-21 LAB — URINALYSIS, COMPLETE (UACMP) WITH MICROSCOPIC
Bacteria, UA: NONE SEEN
Bilirubin Urine: NEGATIVE
Glucose, UA: NEGATIVE mg/dL
Ketones, ur: 5 mg/dL — AB
Nitrite: NEGATIVE
Protein, ur: NEGATIVE mg/dL
RBC / HPF: >50 RBC/hpf — ABNORMAL HIGH (ref 0–5)
Specific Gravity, Urine: >1.046 — ABNORMAL HIGH (ref 1.005–1.030)
Squamous Epithelial / HPF: NONE SEEN (ref 0–5)
pH: 6 (ref 5.0–8.0)

## 2019-10-21 MED ORDER — IOHEXOL 300 MG/ML  SOLN
100.0000 mL | Freq: Once | INTRAMUSCULAR | Status: AC | PRN
  Administered 2019-10-21: 100 mL via INTRAVENOUS

## 2019-10-21 NOTE — Progress Notes (Signed)
[facility] [address] [place], [place] [address]  Internal MEDICINE  Office Visit Note  Patient Name: Michael Everett  [id number]  [id number]  Date of Service: 10/21/2019  Chief Complaint  Patient presents with  . Back Pain  . Abdominal Pain    Has been throwing up, smaller appetite than normal      HPI Pt is here for a sick visit.  He reports at 4am he woke up with right side back pain, and right lower abdominal pain.  He describes the pain as constant aching pain.  He denies any urinary changes at this time. No hematuria, diarrhea or fever.     Current Medication:  Outpatient Encounter Medications as of 10/21/2019  Medication Sig  . aspirin EC 81 MG tablet Take 81 mg by mouth daily.  Marland Kitchen atorvastatin (LIPITOR) 10 MG tablet TAKE 1 TABLET (10 MG TOTAL) BY MOUTH DAILY AT 6 PM.  . fluticasone (FLONASE) 50 MCG/ACT nasal spray PLACE 1 SPRAY INTO BOTH NOSTRILS DAILY.  Marland Kitchen lisinopril (ZESTRIL) 40 MG tablet TAKE 1 TABLET BY MOUTH EVERY DAY FOR BLOOD PRESSURE CONTROL  . pneumococcal 23 valent vaccine (PNEUMOVAX 23) 25 MCG/0.5ML injection Inject 0.25ml IM once   No facility-administered encounter medications on file as of 10/21/2019.      Medical History: Past Medical History:  Diagnosis Date  . Hyperlipidemia   . Hypertension      Vital Signs: BP (!) 160/88   Pulse 62   Temp 97.9 F (36.6 C)   Resp 16   Ht 6' (1.829 m)   Wt 161 lb 3.2 oz (73.1 kg)   SpO2 97%   BMI 21.86 kg/m    Review of Systems  Constitutional: Negative.  Negative for chills, fatigue and unexpected weight change.  HENT: Negative.  Negative for congestion, rhinorrhea, sneezing and sore throat.   Eyes: Negative for redness.  Respiratory: Negative.  Negative for cough, chest tightness and shortness of breath.   Cardiovascular: Negative.  Negative for chest pain and palpitations.  Gastrointestinal: Negative.  Negative for abdominal pain, constipation, diarrhea, nausea and vomiting.   Endocrine: Negative.   Genitourinary: Negative.  Negative for dysuria and frequency.  Musculoskeletal: Negative.  Negative for arthralgias, back pain, joint swelling and neck pain.  Skin: Negative.  Negative for rash.  Allergic/Immunologic: Negative.   Neurological: Negative.  Negative for tremors and numbness.  Hematological: Negative for adenopathy. Does not bruise/bleed easily.  Psychiatric/Behavioral: Negative.  Negative for behavioral problems, sleep disturbance and suicidal ideas. The patient is not nervous/anxious.     Physical Exam Vitals and nursing note reviewed.  Constitutional:      General: He is not in acute distress.    Appearance: He is well-developed. He is not diaphoretic.  HENT:     Head: Normocephalic and atraumatic.     Mouth/Throat:     Pharynx: No oropharyngeal exudate.  Eyes:     Pupils: Pupils are equal, round, and reactive to light.  Neck:     Thyroid: No thyromegaly.     Vascular: No JVD.     Trachea: No tracheal deviation.  Cardiovascular:     Rate and Rhythm: Normal rate and regular rhythm.     Heart sounds: Normal heart sounds. No murmur heard.  No friction rub. No gallop.   Pulmonary:     Effort: Pulmonary effort is normal. No respiratory distress.     Breath sounds: Normal breath sounds. No wheezing or rales.  Chest:  Chest wall: No tenderness.  Abdominal:     Palpations: Abdomen is soft.     Tenderness: There is no abdominal tenderness. There is no guarding.  Musculoskeletal:        General: Normal range of motion.     Cervical back: Normal range of motion and neck supple.  Lymphadenopathy:     Cervical: No cervical adenopathy.  Skin:    General: Skin is warm and dry.  Neurological:     Mental Status: He is alert and oriented to person, place, and time.     Cranial Nerves: No cranial nerve deficit.  Psychiatric:        Behavior: Behavior normal.        Thought Content: Thought content normal.        Judgment: Judgment normal.     Assessment/Plan: 1. Right lower quadrant abdominal pain CT to rule out abdominal or urinary pathology.  - CT Abdomen Pelvis W Contrast; Future -Ct shows obstructing Renal sone, sent patient to ED.  2. Dysuria UA shows blood, suspected stone.   3. Essential hypertension Elevated today due to pain.    General Counseling: Michael Everett understanding of the findings of todays visit and agrees with plan of treatment. I have discussed any further diagnostic evaluation that may be needed or ordered today. We also reviewed his medications today. he has been encouraged to call the office with any questions or concerns that should arise related to todays visit.   Orders Placed This Encounter  Procedures  . CT Abdomen Pelvis W Contrast    No orders of the defined types were placed in this encounter.   Time spent: 30 Minutes  This patient was seen by Blima Ledger AGNP-C in Collaboration with Dr Lyndon Code as a part of collaborative care agreement.  Johnna Acosta AGNP-C Internal Medicine

## 2019-10-21 NOTE — ED Triage Notes (Signed)
Pt presents to ED with right lower abd and right sided flank pain since around 5 am. Pt states he was seen by his pcp who promptly performed a CT scan. Obstructing kidney stone seen in ureter and pt was sent for further evaluation by his pcp. Pain has continued and pt states he has not taken anything for pain at this time.

## 2019-10-22 ENCOUNTER — Other Ambulatory Visit: Payer: Self-pay | Admitting: Radiology

## 2019-10-22 ENCOUNTER — Ambulatory Visit
Admission: RE | Admit: 2019-10-22 | Discharge: 2019-10-22 | Disposition: A | Discharge status: Home / Self Care | Payer: Medicare HMO | Source: Home / Self Care | Attending: Urology | Admitting: Urology

## 2019-10-22 ENCOUNTER — Emergency Department
Admission: EM | Admit: 2019-10-22 | Discharge: 2019-10-22 | Disposition: A | Discharge status: Home / Self Care | Payer: Medicare HMO | Attending: Emergency Medicine | Admitting: Emergency Medicine

## 2019-10-22 ENCOUNTER — Other Ambulatory Visit: Payer: Self-pay | Admitting: *Deleted

## 2019-10-22 ENCOUNTER — Ambulatory Visit
Admission: RE | Admit: 2019-10-22 | Discharge: 2019-10-22 | Disposition: A | Discharge status: Home / Self Care | Payer: Medicare HMO | Source: Ambulatory Visit | Attending: Urology | Admitting: Urology

## 2019-10-22 ENCOUNTER — Encounter: Payer: Self-pay | Admitting: Urology

## 2019-10-22 ENCOUNTER — Ambulatory Visit (INDEPENDENT_AMBULATORY_CARE_PROVIDER_SITE_OTHER): Payer: Medicare HMO | Admitting: Urology

## 2019-10-22 VITALS — BP 150/83 | HR 65 | Ht 72.0 in | Wt 161.0 lb

## 2019-10-22 DIAGNOSIS — N200 Calculus of kidney: Secondary | ICD-10-CM | POA: Insufficient documentation

## 2019-10-22 DIAGNOSIS — N201 Calculus of ureter: Secondary | ICD-10-CM | POA: Diagnosis not present

## 2019-10-22 DIAGNOSIS — N132 Hydronephrosis with renal and ureteral calculous obstruction: Secondary | ICD-10-CM

## 2019-10-22 DIAGNOSIS — N23 Unspecified renal colic: Secondary | ICD-10-CM | POA: Diagnosis not present

## 2019-10-22 MED ORDER — ONDANSETRON 4 MG PO TBDP: 4.0000 mg | ORAL_TABLET | Freq: Three times a day (TID) | ORAL | 0 refills | Status: DC | PRN

## 2019-10-22 MED ORDER — MORPHINE SULFATE (PF) 2 MG/ML IV SOLN
2.0000 mg | Freq: Once | INTRAVENOUS | Status: AC
  Administered 2019-10-22: 2 mg via INTRAVENOUS
  Filled 2019-10-22: qty 1

## 2019-10-22 MED ORDER — TAMSULOSIN HCL 0.4 MG PO CAPS: 0.4000 mg | ORAL_CAPSULE | Freq: Every day | ORAL | 0 refills | Status: DC

## 2019-10-22 MED ORDER — OXYCODONE-ACETAMINOPHEN 5-325 MG PO TABS: 1.0000 | ORAL_TABLET | ORAL | 0 refills | Status: DC | PRN

## 2019-10-22 MED ORDER — ONDANSETRON HCL 4 MG/2ML IJ SOLN
4.0000 mg | Freq: Once | INTRAMUSCULAR | Status: AC
  Administered 2019-10-22: 4 mg via INTRAVENOUS
  Filled 2019-10-22: qty 2

## 2019-10-22 NOTE — ED Notes (Signed)
Pt unable to sign E-signature due to signature pad malfunction. Pt verbalized understanding of d/c instructions and had no additional questions or concerns for this RN or provider. Pt left with d/c instructions and gathered all personal belongings from room and removed them prior to ED departure.   

## 2019-10-22 NOTE — ED Provider Notes (Signed)
[facility] Emergency Department Provider Note __________   First MD Initiated Contact with Patient 10/22/19 0315     (approximate)  I have reviewed the triage vital signs and the nursing notes.   HISTORY  Chief Complaint Abdominal Pain and Flank Pain    HPI Michael Everett is a 73 y.o. male below list of previous chronic medical conditions including recently diagnosed on 10/21/2019 obstructing 6 mm right mid ureter kidney stone resents to the emergency department referred by primary care provider secondary to pain.  Patient states that current pain score is 9 out of 10.  Patient denies any fever no dysuria.  Patient denies any decreased urine production.  Patient states that he started experiencing right flank pain yesterday.  Patient also admits to nausea with episode of vomiting.        Past Medical History:  Diagnosis Date  . Hyperlipidemia   . Hypertension     Patient Active Problem List   Diagnosis Date Noted  . Encounter for general adult medical examination with abnormal findings 09/29/2019  . Bradycardia 03/29/2019  . OSA on CPAP 03/29/2019  . Diastolic dysfunction 11/17/2018  . Snoring 11/17/2018  . Special screening for malignant neoplasm of prostate 09/17/2017  . Need for vaccination against Streptococcus pneumoniae using pneumococcal conjugate vaccine 13 09/17/2017  . Dysuria 09/17/2017  . Hypertension 04/29/2017  . Hyperlipidemia 04/29/2017  . Cervicalgia 04/29/2017    Past Surgical History:  Procedure Laterality Date  . cataract and lens implant right eye  2015  . CATARACT EXTRACTION, BILATERAL Bilateral 10/2013  . VASECTOMY      Prior to Admission medications   Medication Sig Start Date End Date Taking? Authorizing Provider  aspirin EC 81 MG tablet Take 81 mg by mouth daily.    [provider]  atorvastatin (LIPITOR) 10 MG tablet TAKE 1 TABLET (10 MG TOTAL) BY MOUTH DAILY AT 6 PM. 09/21/19   Lyndon Code, MD    fluticasone (FLONASE) 50 MCG/ACT nasal spray PLACE 1 SPRAY INTO BOTH NOSTRILS DAILY. 08/14/19   Johnna Acosta, NP  lisinopril (ZESTRIL) 40 MG tablet TAKE 1 TABLET BY MOUTH EVERY DAY FOR BLOOD PRESSURE CONTROL 09/21/19   Lyndon Code, MD  pneumococcal 23 valent vaccine (PNEUMOVAX 23) 25 MCG/0.5ML injection Inject 0.70ml IM once 09/29/19   Carlean Jews, NP    Allergies Patient has no known allergies.  Family History  Problem Relation Age of Onset  . Hyperlipidemia Mother   . Heart disease Father     Social History Social History   Tobacco Use  . Smoking status: Former Smoker    Types: Cigarettes  . Smokeless tobacco: Never Used  Vaping Use  . Vaping Use: Never used  Substance Use Topics  . Alcohol use: No  . Drug use: No    Review of Systems Constitutional: No fever/chills Eyes: No visual changes. ENT: No sore throat. Cardiovascular: Denies chest pain. Respiratory: Denies shortness of breath. Gastrointestinal: No abdominal pain.  No nausea, no vomiting.  No diarrhea.  No constipation. Genitourinary: Negative for dysuria. Musculoskeletal: Negative for neck pain.  Negative for back pain. Integumentary: Negative for rash. Neurological: Negative for headaches, focal weakness or numbness.   ____________________________________________   PHYSICAL EXAM:  VITAL SIGNS: ED Triage Vitals  Enc Vitals Group     BP 10/21/19 1920 (!) 177/90     Pulse Rate 10/21/19 1920 64     Resp 10/21/19 1920 18     Temp  10/21/19 1920 98.7 F (37.1 C)     Temp Source 10/21/19 1920 Oral     SpO2 10/21/19 1920 99 %     Weight 10/21/19 1925 73 kg (161 lb)     Height 10/21/19 1925 1.829 m (6')     Head Circumference --      Peak Flow --      Pain Score 10/21/19 1925 9     Pain Loc --      Pain Edu? --      Excl. in GC? --     Constitutional: Alert and oriented.  Eyes: Conjunctivae are normal.  Head: Atraumatic. Mouth/Throat: Patient is wearing a mask. Neck: No stridor.  No  meningeal signs.   Cardiovascular: Normal rate, regular rhythm. Good peripheral circulation. Grossly normal heart sounds. Respiratory: Normal respiratory effort.  No retractions. Gastrointestinal: Soft and nontender. No distention.  Musculoskeletal: No lower extremity tenderness nor edema. No gross deformities of extremities. Neurologic:  Normal speech and language. No gross focal neurologic deficits are appreciated.  Skin:  Skin is warm, dry and intact. Psychiatric: Mood and affect are normal. Speech and behavior are normal.  ____________________________________________   LABS (all labs ordered are listed, but only abnormal results are displayed)  Labs Reviewed  COMPREHENSIVE METABOLIC PANEL - Abnormal; Notable for the following components:      Result Value   Glucose, Bld 124 (*)    BUN 24 (*)    Creatinine, Ser 1.42 (*)    Total Bilirubin 1.5 (*)    GFR calc non Af Amer 48 (*)    GFR calc Af Amer 55 (*)    All other components within normal limits  CBC - Abnormal; Notable for the following components:   WBC 11.0 (*)    All other components within normal limits  URINALYSIS, COMPLETE (UACMP) WITH MICROSCOPIC - Abnormal; Notable for the following components:   Color, Urine YELLOW (*)    APPearance CLEAR (*)    Specific Gravity, Urine >1.046 (*)    Hgb urine dipstick LARGE (*)    Ketones, ur 5 (*)    Leukocytes,Ua TRACE (*)    RBC / HPF >50 (*)    All other components within normal limits     RADIOLOGY I, [place] Ahmiyah Coil, personally viewed and evaluated these images (plain radiographs) as part of my medical decision making, as well as reviewing the written report by the radiologist.  ED MD interpretation:    Official radiology report(s): CT Abdomen Pelvis W Contrast  Result Date: 10/21/2019 CLINICAL DATA:  Right lower quadrant pain. EXAM: CT ABDOMEN AND PELVIS WITH CONTRAST TECHNIQUE: Multidetector CT imaging of the abdomen and pelvis was performed using the standard  protocol following bolus administration of intravenous contrast. CONTRAST:  OMNIPAQUE IOHEXOL 300 MG/ML  SOLN COMPARISON:  None. FINDINGS: Lower chest: No acute abnormality. Hepatobiliary: No focal liver abnormality is seen. No gallstones, gallbladder wall thickening, or biliary dilatation. Pancreas: Unremarkable. No pancreatic ductal dilatation or surrounding inflammatory changes. Spleen: Normal in size without focal abnormality. Adrenals/Urinary Tract: Adrenal glands are unremarkable. Kidneys are normal in size, without focal lesions. A 6 mm obstructing renal stone is seen within the mid right ureter with moderate severity right-sided hydronephrosis and hydroureter. Delayed renal cortical enhancement is also seen on the right. Bladder is unremarkable. Stomach/Bowel: Stomach is within normal limits. Appendix appears normal. No evidence of bowel wall thickening, distention, or inflammatory changes. Noninflamed diverticula are seen throughout the large bowel. Vascular/Lymphatic: There is mild calcification  of the abdominal aorta and bilateral common iliac arteries. No enlarged abdominal or pelvic lymph nodes. Reproductive: There is moderate to marked severity prostate gland enlargement. Other: No abdominal wall hernia or abnormality. No abdominopelvic ascites. Musculoskeletal: No acute or significant osseous findings. IMPRESSION: 1. 6 mm obstructing renal stone within the mid right ureter with moderate severity right-sided hydronephrosis and hydroureter. 2. Colonic diverticulosis. 3. 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 4. Aortic atherosclerosis. Aortic Atherosclerosis (ICD10-I70.0). Electronically Signed   By: Aram Candela M.D.   On: 10/21/2019 17:28      Procedures   ____________________________________________   INITIAL IMPRESSION / MDM / ASSESSMENT AND PLAN / ED COURSE  As part of my medical decision making, I reviewed the following data within the electronic medical  record:    74 year old male presented with above-stated history and physical exam secondary to obstructing right millimeter kidney stone.  Patient given IV morphine 2 mg and Zofran 4 million with complete resolution of pain at present with no nausea and vomiting.  Patient discussed with Dr. Lonna Cobb urologist on-call who advised of pain control could be achieved the patient with be able to be discharged home with outpatient follow-up for possible lithotripsy on Thursday.  I spoke with the patient at length and informed them of all clinical findings as well as warning signs that would warrant immediate return to the emergency department  ____________________________________________  FINAL CLINICAL IMPRESSION(S) / ED DIAGNOSES  Final diagnoses:  Right kidney stone     MEDICATIONS GIVEN DURING THIS VISIT:  Medications  morphine 2 MG/ML injection 2 mg (2 mg Intravenous Given 10/22/19 0326)  ondansetron (ZOFRAN) injection 4 mg (4 mg Intravenous Given 10/22/19 0327)     ED Discharge Orders    None      *Please note:  Michael Everett was evaluated in Emergency Department on 10/22/2019 for the symptoms described in the history of present illness. He was evaluated in the context of the global COVID-19 pandemic, which necessitated consideration that the patient might be at risk for infection with the SARS-CoV-2 virus that causes COVID-19. Institutional protocols and algorithms that pertain to the evaluation of patients at risk for COVID-19 are in a state of rapid change based on information released by regulatory bodies including the CDC and federal and state organizations. These policies and algorithms were followed during the patient's care in the ED.  Some ED evaluations and interventions may be delayed as a result of limited staffing during and after the pandemic.*  Note:  This document was prepared using Dragon voice recognition software and may include unintentional dictation errors.   Darci Current, MD 10/22/19 [phone]

## 2019-10-23 ENCOUNTER — Other Ambulatory Visit: Payer: Self-pay

## 2019-10-23 ENCOUNTER — Encounter
Admission: RE | Disposition: A | Discharge status: Home / Self Care | Payer: Self-pay | Source: Home / Self Care | Attending: Urology

## 2019-10-23 ENCOUNTER — Ambulatory Visit
Admission: RE | Admit: 2019-10-23 | Discharge: 2019-10-23 | Disposition: A | Discharge status: Home / Self Care | Payer: Medicare HMO | Attending: Urology | Admitting: Urology

## 2019-10-23 ENCOUNTER — Encounter: Payer: Self-pay | Admitting: Urology

## 2019-10-23 DIAGNOSIS — Z87891 Personal history of nicotine dependence: Secondary | ICD-10-CM | POA: Diagnosis not present

## 2019-10-23 DIAGNOSIS — Z79899 Other long term (current) drug therapy: Secondary | ICD-10-CM | POA: Insufficient documentation

## 2019-10-23 DIAGNOSIS — Z7982 Long term (current) use of aspirin: Secondary | ICD-10-CM | POA: Insufficient documentation

## 2019-10-23 DIAGNOSIS — N201 Calculus of ureter: Secondary | ICD-10-CM

## 2019-10-23 DIAGNOSIS — G4730 Sleep apnea, unspecified: Secondary | ICD-10-CM | POA: Diagnosis not present

## 2019-10-23 DIAGNOSIS — E785 Hyperlipidemia, unspecified: Secondary | ICD-10-CM | POA: Insufficient documentation

## 2019-10-23 DIAGNOSIS — I10 Essential (primary) hypertension: Secondary | ICD-10-CM | POA: Insufficient documentation

## 2019-10-23 DIAGNOSIS — N200 Calculus of kidney: Secondary | ICD-10-CM | POA: Diagnosis not present

## 2019-10-23 HISTORY — PX: EXTRACORPOREAL SHOCK WAVE LITHOTRIPSY: SHX1557

## 2019-10-23 SURGERY — LITHOTRIPSY, ESWL
Anesthesia: Moderate Sedation | Laterality: Right

## 2019-10-23 MED ORDER — SODIUM CHLORIDE 0.9 % IV SOLN: INTRAVENOUS | Status: DC

## 2019-10-23 MED ORDER — ONDANSETRON HCL 4 MG/2ML IJ SOLN: 4.0000 mg | Freq: Once | INTRAMUSCULAR | Status: AC | PRN

## 2019-10-23 MED ORDER — DIAZEPAM 5 MG PO TABS
10.0000 mg | ORAL_TABLET | ORAL | Status: AC
  Administered 2019-10-23: 10 mg via ORAL

## 2019-10-23 MED ORDER — LACTATED RINGERS IV SOLN: INTRAVENOUS | Status: DC

## 2019-10-23 MED ORDER — DIAZEPAM 5 MG PO TABS
ORAL_TABLET | ORAL | Status: AC
  Filled 2019-10-23: qty 2

## 2019-10-23 MED ORDER — ONDANSETRON HCL 4 MG/2ML IJ SOLN
INTRAMUSCULAR | Status: AC
  Administered 2019-10-23: 4 mg via INTRAVENOUS
  Filled 2019-10-23: qty 2

## 2019-10-23 MED ORDER — DIPHENHYDRAMINE HCL 25 MG PO CAPS
ORAL_CAPSULE | ORAL | Status: AC
  Filled 2019-10-23: qty 1

## 2019-10-23 MED ORDER — CIPROFLOXACIN HCL 500 MG PO TABS
ORAL_TABLET | ORAL | Status: AC
  Filled 2019-10-23: qty 1

## 2019-10-23 MED ORDER — DIPHENHYDRAMINE HCL 25 MG PO CAPS
25.0000 mg | ORAL_CAPSULE | ORAL | Status: AC
  Administered 2019-10-23: 25 mg via ORAL

## 2019-10-23 MED ORDER — CIPROFLOXACIN HCL 500 MG PO TABS
500.0000 mg | ORAL_TABLET | ORAL | Status: AC
  Administered 2019-10-23: 500 mg via ORAL

## 2019-10-23 NOTE — Discharge Instructions (Signed)
AMBULATORY SURGERY  DISCHARGE INSTRUCTIONS   1) The drugs that you were given will stay in your system until tomorrow so for the next 24 hours you should not:  A) Drive an automobile B) Make any legal decisions C) Drink any alcoholic beverage   2) You may resume regular meals tomorrow.  Today it is better to start with liquids and gradually work up to solid foods.  You may eat anything you prefer, but it is better to start with liquids, then soup and crackers, and gradually work up to solid foods.   3) Please notify your doctor immediately if you have any unusual bleeding, trouble breathing, redness and pain at the surgery site, drainage, fever, or pain not relieved by medication.    4) Additional Instructions:   As per the [facility] stone [facility] discharge instructions     Please contact your physician with any problems or Same Day Surgery at [phone], Monday through Friday 6 am to 4 pm, or [place] at [facility] number at [phone].

## 2019-10-23 NOTE — H&P (View-Only) (Signed)
10/22/2019 10:12 AM   Michael Everett Michael Everett 16, 1947 [id number]  Referring provider: Carlean Jews, NP [address] [place],  [place] [address]  Chief Complaint  Patient presents with  . Nephrolithiasis    HPI: Michael Everett is a 74 y.o. male who presents for ED follow-up of right renal colic.   Onset of right flank pain radiating to right lower quadrant 10/21/2019  Saw PCP and CT ordered   Pain progressively worsening necessitating ED visit  Severity rated 9/10  No identifiable precipitating, aggravating or alleviating factors  + Nausea with vomiting  No fever or chills  CT abdomen pelvis with contrast did show a 7 mm right proximal ureteral calculus with moderate hydronephrosis and upstream hydroureter  Pain control with parenteral narcotics  Pain is currently controlled on oral narcotic analgesics  Past urologic history markable for vasectomy and one episode of postvasectomy epididymitis  No previous history stone disease  Started on tamsulosin   PMH: Past Medical History:  Diagnosis Date  . Hyperlipidemia   . Hypertension     Surgical History: Past Surgical History:  Procedure Laterality Date  . cataract and lens implant right eye  2015  . CATARACT EXTRACTION, BILATERAL Bilateral 10/2013  . VASECTOMY      Home Medications:  Allergies as of 10/22/2019   No Known Allergies     Medication List       Accurate as of Michael Everett 4, 2021 11:59 PM. If you have any questions, ask your nurse or doctor.        aspirin EC 81 MG tablet Take 81 mg by mouth daily.   atorvastatin 10 MG tablet Commonly known as: LIPITOR TAKE 1 TABLET (10 MG TOTAL) BY MOUTH DAILY AT 6 PM.   fluticasone 50 MCG/ACT nasal spray Commonly known as: FLONASE PLACE 1 SPRAY INTO BOTH NOSTRILS DAILY.   lisinopril 40 MG tablet Commonly known as: ZESTRIL TAKE 1 TABLET BY MOUTH EVERY DAY FOR BLOOD PRESSURE CONTROL   ondansetron 4 MG disintegrating tablet Commonly known as:  Zofran ODT Take 1 tablet (4 mg total) by mouth every 8 (eight) hours as needed.   oxyCODONE-acetaminophen 5-325 MG tablet Commonly known as: Percocet Take 1 tablet by mouth every 4 (four) hours as needed.   Pneumovax 23 25 MCG/0.5ML injection Generic drug: pneumococcal 23 valent vaccine Inject 0.53ml IM once   sildenafil 20 MG tablet Commonly known as: REVATIO Take 2-3 tabs po 1 hr prior  To desired sexual activity   tamsulosin 0.4 MG Caps capsule Commonly known as: Flomax Take 1 capsule (0.4 mg total) by mouth daily.       Allergies: No Known Allergies  Family History: Family History  Problem Relation Age of Onset  . Hyperlipidemia Mother   . Heart disease Father     Social History:  reports that he has quit smoking. His smoking use included cigarettes. He has never used smokeless tobacco. He reports that he does not drink alcohol and does not use drugs.   Physical Exam: BP (!) 150/83   Pulse 65   Ht 6' (1.829 m)   Wt 161 lb (73 kg)   BMI 21.84 kg/m   Constitutional:  Alert and oriented, No acute distress. HEENT: [place] AT, moist mucus membranes.  Trachea midline, no masses. Cardiovascular: No clubbing, cyanosis, or edema.  RRR Respiratory: Normal respiratory effort, no increased work of breathing.  Lungs clear GI: Abdomen is soft, nontender, nondistended, no abdominal masses GU: No CVA tenderness Lymph: No cervical or  inguinal lymphadenopathy. Skin: No rashes, bruises or suspicious lesions. Neurologic: Grossly intact, no focal deficits, moving all 4 extremities. Psychiatric: Normal mood and affect.  Laboratory Data: Lab Results  Component Value Date   WBC 11.0 (H) 10/21/2019   HGB 15.9 10/21/2019   HCT 44.9 10/21/2019   MCV 87.2 10/21/2019   PLT 152 10/21/2019    Lab Results  Component Value Date   CREATININE 1.42 (H) 10/21/2019     Pertinent Imaging: Images personally reviewed  Abdomen 1 view (KUB)  Narrative CLINICAL DATA:  74-year-old male  with kidney stone.  EXAM: ABDOMEN - 1 VIEW  COMPARISON:  CT abdomen pelvis dated 10/21/2019.  FINDINGS: There is a 7 mm obstructing stone in the proximal right ureter with mild right hydronephrosis. Excreted contrast noted in the right renal collecting system and proximal right ureter proximal to the stone. No contrast noted in the right ureter distal to the stone consistent with obstruction. Excreted contrast noted within the urinary bladder. There is trabeculated appearance of the bladder wall likely related to chronic bladder outlet obstruction.  There is no bowel dilatation or evidence of obstruction. Oral contrast noted in the proximal colon. The osseous structures and soft tissues are unremarkable.  IMPRESSION: A 7 mm obstructing stone in the proximal right ureter with mild right hydronephrosis.   Electronically Signed By: Arash  Radparvar M.D. On: 10/22/2019 19:27   Assessment & Plan:    1.  Right proximal ureteral calculus  Renal colic secondary to above We discussed various treatment options for urolithiasis including observation with or without medical expulsive therapy, shockwave lithotripsy (SWL), ureteroscopy and laser lithotripsy with stent placement. We discussed that management is based on stone size, location, density, patient co-morbidities, and patient preference.  Stones <5mm in size have a >80% spontaneous passage rate. Data surrounding the use of tamsulosin for medical expulsive therapy is controversial, but meta analyses suggests it is most efficacious for distal stones between 5-10mm in size. Possible side effects include dizziness/lightheadedness, and retrograde ejaculation. SWL has a lower stone free rate in a single procedure, but also a lower complication rate compared to ureteroscopy and avoids a stent and associated stent related symptoms. Possible complications include renal hematoma, steinstrasse, and need for additional treatment. Ureteroscopy  with laser lithotripsy and stent placement has a higher stone free rate than SWL in a single procedure, however increased complication rate including possible infection, ureteral injury, bleeding, and stent related morbidity. Common stent related symptoms include dysuria, urgency/frequency, and flank pain. After an extensive discussion of the risks and benefits of the above treatment options, the patient would like to proceed with shockwave lithotripsy.    Beyla Loney C Jansen Goodpasture, MD  [facility] [address], [address] [place], [place] [address] [phone]  

## 2019-10-23 NOTE — Progress Notes (Signed)
10/22/2019 10:12 AM   Michael Everett November 03, 1945 [id number]  Referring provider: Carlean Jews, NP [address] [place],  [place] [address]  Chief Complaint  Patient presents with  . Nephrolithiasis    HPI: Michael Everett is a 74 y.o. male who presents for ED follow-up of right renal colic.   Onset of right flank pain radiating to right lower quadrant 10/21/2019  Saw PCP and CT ordered   Pain progressively worsening necessitating ED visit  Severity rated 9/10  No identifiable precipitating, aggravating or alleviating factors  + Nausea with vomiting  No fever or chills  CT abdomen pelvis with contrast did show a 7 mm right proximal ureteral calculus with moderate hydronephrosis and upstream hydroureter  Pain control with parenteral narcotics  Pain is currently controlled on oral narcotic analgesics  Past urologic history markable for vasectomy and one episode of postvasectomy epididymitis  No previous history stone disease  Started on tamsulosin   PMH: Past Medical History:  Diagnosis Date  . Hyperlipidemia   . Hypertension     Surgical History: Past Surgical History:  Procedure Laterality Date  . cataract and lens implant right eye  2015  . CATARACT EXTRACTION, BILATERAL Bilateral 10/2013  . VASECTOMY      Home Medications:  Allergies as of 10/22/2019   No Known Allergies     Medication List       Accurate as of October 22, 2019 11:59 PM. If you have any questions, ask your nurse or doctor.        aspirin EC 81 MG tablet Take 81 mg by mouth daily.   atorvastatin 10 MG tablet Commonly known as: LIPITOR TAKE 1 TABLET (10 MG TOTAL) BY MOUTH DAILY AT 6 PM.   fluticasone 50 MCG/ACT nasal spray Commonly known as: FLONASE PLACE 1 SPRAY INTO BOTH NOSTRILS DAILY.   lisinopril 40 MG tablet Commonly known as: ZESTRIL TAKE 1 TABLET BY MOUTH EVERY DAY FOR BLOOD PRESSURE CONTROL   ondansetron 4 MG disintegrating tablet Commonly known as:  Zofran ODT Take 1 tablet (4 mg total) by mouth every 8 (eight) hours as needed.   oxyCODONE-acetaminophen 5-325 MG tablet Commonly known as: Percocet Take 1 tablet by mouth every 4 (four) hours as needed.   Pneumovax 23 25 MCG/0.5ML injection Generic drug: pneumococcal 23 valent vaccine Inject 0.53ml IM once   sildenafil 20 MG tablet Commonly known as: REVATIO Take 2-3 tabs po 1 hr prior  To desired sexual activity   tamsulosin 0.4 MG Caps capsule Commonly known as: Flomax Take 1 capsule (0.4 mg total) by mouth daily.       Allergies: No Known Allergies  Family History: Family History  Problem Relation Age of Onset  . Hyperlipidemia Mother   . Heart disease Father     Social History:  reports that he has quit smoking. His smoking use included cigarettes. He has never used smokeless tobacco. He reports that he does not drink alcohol and does not use drugs.   Physical Exam: BP (!) 150/83   Pulse 65   Ht 6' (1.829 m)   Wt 161 lb (73 kg)   BMI 21.84 kg/m   Constitutional:  Alert and oriented, No acute distress. HEENT: [place] AT, moist mucus membranes.  Trachea midline, no masses. Cardiovascular: No clubbing, cyanosis, or edema.  RRR Respiratory: Normal respiratory effort, no increased work of breathing.  Lungs clear GI: Abdomen is soft, nontender, nondistended, no abdominal masses GU: No CVA tenderness Lymph: No cervical or  inguinal lymphadenopathy. Skin: No rashes, bruises or suspicious lesions. Neurologic: Grossly intact, no focal deficits, moving all 4 extremities. Psychiatric: Normal mood and affect.  Laboratory Data: Lab Results  Component Value Date   WBC 11.0 (H) 10/21/2019   HGB 15.9 10/21/2019   HCT 44.9 10/21/2019   MCV 87.2 10/21/2019   PLT 152 10/21/2019    Lab Results  Component Value Date   CREATININE 1.42 (H) 10/21/2019     Pertinent Imaging: Images personally reviewed  Abdomen 1 view (KUB)  Narrative CLINICAL DATA:  74 year old male  with kidney stone.  EXAM: ABDOMEN - 1 VIEW  COMPARISON:  CT abdomen pelvis dated 10/21/2019.  FINDINGS: There is a 7 mm obstructing stone in the proximal right ureter with mild right hydronephrosis. Excreted contrast noted in the right renal collecting system and proximal right ureter proximal to the stone. No contrast noted in the right ureter distal to the stone consistent with obstruction. Excreted contrast noted within the urinary bladder. There is trabeculated appearance of the bladder wall likely related to chronic bladder outlet obstruction.  There is no bowel dilatation or evidence of obstruction. Oral contrast noted in the proximal colon. The osseous structures and soft tissues are unremarkable.  IMPRESSION: A 7 mm obstructing stone in the proximal right ureter with mild right hydronephrosis.   Electronically Signed By: Elgie Collard M.D. On: 10/22/2019 19:27   Assessment & Plan:    1.  Right proximal ureteral calculus  Renal colic secondary to above We discussed various treatment options for urolithiasis including observation with or without medical expulsive therapy, shockwave lithotripsy (SWL), ureteroscopy and laser lithotripsy with stent placement. We discussed that management is based on stone size, location, density, patient co-morbidities, and patient preference.  Stones <23mm in size have a >80% spontaneous passage rate. Data surrounding the use of tamsulosin for medical expulsive therapy is controversial, but meta analyses suggests it is most efficacious for distal stones between 5-21mm in size. Possible side effects include dizziness/lightheadedness, and retrograde ejaculation. SWL has a lower stone free rate in a single procedure, but also a lower complication rate compared to ureteroscopy and avoids a stent and associated stent related symptoms. Possible complications include renal hematoma, steinstrasse, and need for additional treatment. Ureteroscopy  with laser lithotripsy and stent placement has a higher stone free rate than SWL in a single procedure, however increased complication rate including possible infection, ureteral injury, bleeding, and stent related morbidity. Common stent related symptoms include dysuria, urgency/frequency, and flank pain. After an extensive discussion of the risks and benefits of the above treatment options, the patient would like to proceed with shockwave lithotripsy.    Riki Altes, MD  [facility] [address], [address] [place], [place] [address] [phone]

## 2019-10-23 NOTE — Interval H&P Note (Signed)
History and Physical Interval Note:  10/23/2019 11:10 AM  Michael Everett  has presented today for surgery, with the diagnosis of Kidney stone.  The various methods of treatment have been discussed with the patient and family. After consideration of risks, benefits and other options for treatment, the patient has consented to  Procedure(s): EXTRACORPOREAL SHOCK WAVE LITHOTRIPSY (ESWL) (Right) as a surgical intervention.  The patient's history has been reviewed, patient examined, no change in status, stable for surgery.  I have reviewed the patient's chart and labs.  Questions were answered to the patient's satisfaction.     Priyal Musquiz C Jaxyn Rout

## 2019-11-06 NOTE — Progress Notes (Signed)
11/07/2019 9:30 AM   Michael Everett 1945-11-29 [id number]  Referring provider: Carlean Jews, NP [address] [place],  [place] [address]  Chief Complaint  Patient presents with  . Nephrolithiasis    HPI: Michael Everett is a 74 y.o. who is status post ESWL who presents today for follow up.  Underwent ESWL on 10/23/2019 for 7 mm right proximal ureteral calculus with Dr. Lonna Cobb.  His postprocedural course was as expected and uneventful.   He has passed fragments and brings them in for analysis.    KUB the previous right proximal ureteral calculus is not visible  PMH: Past Medical History:  Diagnosis Date  . Hyperlipidemia   . Hypertension     Surgical History: Past Surgical History:  Procedure Laterality Date  . cataract and lens implant right eye  2015  . CATARACT EXTRACTION, BILATERAL Bilateral 10/2013  . EXTRACORPOREAL SHOCK WAVE LITHOTRIPSY Right 10/23/2019   Procedure: EXTRACORPOREAL SHOCK WAVE LITHOTRIPSY (ESWL);  Surgeon: Riki Altes, MD;  Location: [facility] ORS;  Service: Urology;  Laterality: Right;  Marland Kitchen VASECTOMY      Home Medications:  Current Outpatient Medications on File Prior to Visit  Medication Sig Dispense Refill  . aspirin EC 81 MG tablet Take 81 mg by mouth daily.    Marland Kitchen atorvastatin (LIPITOR) 10 MG tablet TAKE 1 TABLET (10 MG TOTAL) BY MOUTH DAILY AT 6 PM. 90 tablet 2  . fluticasone (FLONASE) 50 MCG/ACT nasal spray PLACE 1 SPRAY INTO BOTH NOSTRILS DAILY. 48 mL 3  . lisinopril (ZESTRIL) 40 MG tablet TAKE 1 TABLET BY MOUTH EVERY DAY FOR BLOOD PRESSURE CONTROL 90 tablet 2   No current facility-administered medications on file prior to visit.    Allergies: No Known Allergies  Family History: Family History  Problem Relation Age of Onset  . Hyperlipidemia Mother   . Heart disease Father     Social History:  reports that he has quit smoking. His smoking use included cigarettes. He has never used smokeless tobacco. He reports that he does not  drink alcohol and does not use drugs.  ROS: Pertinent ROS in HPI  Physical Exam: BP (!) 153/91   Pulse 80   Ht 6' (1.829 m)   Wt 160 lb (72.6 kg)   BMI 21.70 kg/m   Constitutional:  Well nourished. Alert and oriented, No acute distress. HEENT: [place] AT, mask in place.   Trachea midline. Cardiovascular: No clubbing, cyanosis, or edema. Respiratory: Normal respiratory effort, no increased work of breathing. Neurologic: Grossly intact, no focal deficits, moving all 4 extremities. Psychiatric: Normal mood and affect.  Laboratory Data: Lab Results  Component Value Date   WBC 11.0 (H) 10/21/2019   HGB 15.9 10/21/2019   HCT 44.9 10/21/2019   MCV 87.2 10/21/2019   PLT 152 10/21/2019    Lab Results  Component Value Date   CREATININE 1.42 (H) 10/21/2019       Component Value Date/Time   CHOL 133 09/29/2019 0957   HDL 53 09/29/2019 0957   LDLCALC 65 09/29/2019 0957    Urinalysis    Component Value Date/Time   COLORURINE YELLOW (A) 10/21/2019 1928   APPEARANCEUR CLEAR (A) 10/21/2019 1928   APPEARANCEUR Clear 09/29/2019 0857   LABSPEC >1.046 (H) 10/21/2019 1928   PHURINE 6.0 10/21/2019 1928   GLUCOSEU NEGATIVE 10/21/2019 1928   HGBUR LARGE (A) 10/21/2019 1928   BILIRUBINUR NEGATIVE 10/21/2019 1928   BILIRUBINUR Negative 09/29/2019 0857   KETONESUR 5 (A) 10/21/2019 1928   PROTEINUR  NEGATIVE 10/21/2019 1928   NITRITE NEGATIVE 10/21/2019 1928   LEUKOCYTESUR TRACE (A) 10/21/2019 1928    I have reviewed the labs.   Pertinent Imaging: CLINICAL DATA:  Right ureteral calculus follow-up. Recent lithotripsy.  EXAM: ABDOMEN - 1 VIEW  COMPARISON:  Abdominal x-ray dated October 22, 2019.  FINDINGS: 3 mm calcification at the site of the previously seen right ureteral calculus. No new radiopaque density. Normal bowel gas pattern. No acute osseous abnormality.  IMPRESSION: 1. Residual 3 mm right ureteral calculus fragment status post lithotripsy.   Electronically  Signed   By: Obie Dredge M.D.   On: 11/07/2019 15:45  I have independently reviewed the films.    Assessment & Plan:    1. Right ureteral stone stone sent for analysis  Given booklet for ABCs of stone prevention  2. Right hydronephrosis Obtain RUS to ensure the hydronephrosis has resolved  3. Microscopic hematuria continue to monitor the patient's UA after the treatment/passage of the stone to ensure the hematuria has resolved if hematuria persists, we will pursue a hematuria workup with CT Urogram and cystoscopy if appropriate.   Return in about 1 month (around 12/08/2019) for KUB.  These notes generated with voice recognition software. I apologize for typographical errors.  Michiel Cowboy, PA-C  [facility] [address]  [address] [place], [place] [address] [phone]

## 2019-11-07 ENCOUNTER — Ambulatory Visit
Admission: RE | Admit: 2019-11-07 | Discharge: 2019-11-07 | Disposition: A | Discharge status: Home / Self Care | Payer: Medicare HMO | Attending: Urology | Admitting: Urology

## 2019-11-07 ENCOUNTER — Other Ambulatory Visit: Payer: Self-pay

## 2019-11-07 ENCOUNTER — Encounter: Payer: Self-pay | Admitting: Urology

## 2019-11-07 ENCOUNTER — Ambulatory Visit (INDEPENDENT_AMBULATORY_CARE_PROVIDER_SITE_OTHER): Payer: Medicare HMO | Admitting: Urology

## 2019-11-07 ENCOUNTER — Ambulatory Visit
Admission: RE | Admit: 2019-11-07 | Discharge: 2019-11-07 | Disposition: A | Discharge status: Home / Self Care | Payer: Medicare HMO | Source: Ambulatory Visit | Attending: Urology | Admitting: Urology

## 2019-11-07 VITALS — BP 153/91 | HR 80 | Ht 72.0 in | Wt 160.0 lb

## 2019-11-07 DIAGNOSIS — N200 Calculus of kidney: Secondary | ICD-10-CM | POA: Insufficient documentation

## 2019-11-07 DIAGNOSIS — N132 Hydronephrosis with renal and ureteral calculous obstruction: Secondary | ICD-10-CM

## 2019-11-07 DIAGNOSIS — R3129 Other microscopic hematuria: Secondary | ICD-10-CM

## 2019-11-07 DIAGNOSIS — N201 Calculus of ureter: Secondary | ICD-10-CM

## 2019-11-10 ENCOUNTER — Telehealth: Payer: Self-pay | Admitting: Urology

## 2019-11-10 DIAGNOSIS — N201 Calculus of ureter: Secondary | ICD-10-CM

## 2019-11-10 NOTE — Telephone Encounter (Signed)
Please let Mr. Michael Everett know that the radiologist saw a left over fragment.  We need to push his RUS back (it is scheduled for the 30th) and have him push fluids and continue to take his tamsulosin.  We will need to have him have another X-ray in 2 weeks. g

## 2019-11-12 NOTE — Telephone Encounter (Signed)
Patient notified and states he will be out of town until September 20. He will have x ray at that time. He states he will push the RUS out until the first of October.

## 2019-11-13 LAB — CALCULI, WITH PHOTOGRAPH (CLINICAL LAB)
Calcium Oxalate Dihydrate: 30 %
Calcium Oxalate Monohydrate: 70 %
Weight Calculi: 82 mg

## 2019-11-17 ENCOUNTER — Ambulatory Visit: Payer: Medicare HMO

## 2019-12-08 ENCOUNTER — Ambulatory Visit
Admission: RE | Admit: 2019-12-08 | Discharge: 2019-12-08 | Disposition: A | Discharge status: Home / Self Care | Payer: Medicare HMO | Attending: Urology | Admitting: Urology

## 2019-12-08 ENCOUNTER — Ambulatory Visit
Admission: RE | Admit: 2019-12-08 | Discharge: 2019-12-08 | Disposition: A | Discharge status: Home / Self Care | Payer: Medicare HMO | Source: Ambulatory Visit | Attending: Urology | Admitting: Urology

## 2019-12-08 DIAGNOSIS — N201 Calculus of ureter: Secondary | ICD-10-CM | POA: Insufficient documentation

## 2019-12-11 NOTE — Progress Notes (Signed)
Patient rescheduled

## 2019-12-12 ENCOUNTER — Other Ambulatory Visit: Payer: Self-pay

## 2019-12-12 ENCOUNTER — Ambulatory Visit (INDEPENDENT_AMBULATORY_CARE_PROVIDER_SITE_OTHER): Payer: Medicare HMO | Admitting: Urology

## 2019-12-12 DIAGNOSIS — N201 Calculus of ureter: Secondary | ICD-10-CM

## 2019-12-22 ENCOUNTER — Other Ambulatory Visit: Payer: Self-pay

## 2019-12-22 ENCOUNTER — Ambulatory Visit
Admission: RE | Admit: 2019-12-22 | Discharge: 2019-12-22 | Disposition: A | Discharge status: Home / Self Care | Payer: Medicare HMO | Source: Ambulatory Visit | Attending: Urology | Admitting: Urology

## 2019-12-22 DIAGNOSIS — N201 Calculus of ureter: Secondary | ICD-10-CM | POA: Diagnosis not present

## 2019-12-22 DIAGNOSIS — R3129 Other microscopic hematuria: Secondary | ICD-10-CM | POA: Diagnosis present

## 2019-12-22 DIAGNOSIS — N132 Hydronephrosis with renal and ureteral calculous obstruction: Secondary | ICD-10-CM | POA: Diagnosis present

## 2020-01-01 NOTE — Progress Notes (Signed)
01/02/2020 7:18 PM   Michael Everett [birth year] [id number]  Referring provider: Carlean Jews, NP [address] [place],  [place] [address]  Chief Complaint  Patient presents with  . Nephrolithiasis    HPI: Michael Everett is a 74 y.o. who is status post ESWL who presents today for follow up.  Underwent ESWL on 10/23/2019 for 7 mm right proximal ureteral calculus with Dr. Lonna Everett.    Stone composition is 70% calcium oxalate monohydrate and 30% calcium oxalate dihydrate  KUB the previous right proximal ureteral calculus is not visible  RUS 12/2019 negative for hydro   UA is negative for micro heme  Today, he is experiencing frequency, urgency, incontinence and a weak urinary stream.  He has had the symptoms for several years.  Patient denies any modifying or aggravating factors.  Patient denies any gross hematuria, dysuria or suprapubic/flank pain.  Patient denies any fevers, chills, nausea or vomiting.     PVR 75 mL  PMH: Past Medical History:  Diagnosis Date  . Hyperlipidemia   . Hypertension     Surgical History: Past Surgical History:  Procedure Laterality Date  . cataract and lens implant right eye  2015  . CATARACT EXTRACTION, BILATERAL Bilateral 10/2013  . EXTRACORPOREAL SHOCK WAVE LITHOTRIPSY Right 10/23/2019   Procedure: EXTRACORPOREAL SHOCK WAVE LITHOTRIPSY (ESWL);  Surgeon: Michael Altes, MD;  Location: [facility] ORS;  Service: Urology;  Laterality: Right;  Marland Kitchen VASECTOMY      Home Medications:  Current Outpatient Medications on File Prior to Visit  Medication Sig Dispense Refill  . aspirin EC 81 MG tablet Take 81 mg by mouth daily.    Marland Kitchen atorvastatin (LIPITOR) 10 MG tablet TAKE 1 TABLET (10 MG TOTAL) BY MOUTH DAILY AT 6 PM. 90 tablet 2  . fluticasone (FLONASE) 50 MCG/ACT nasal spray PLACE 1 SPRAY INTO BOTH NOSTRILS DAILY. 48 mL 3  . lisinopril (ZESTRIL) 40 MG tablet TAKE 1 TABLET BY MOUTH EVERY DAY FOR BLOOD PRESSURE CONTROL 90 tablet 2   No current  facility-administered medications on file prior to visit.    Allergies: No Known Allergies  Family History: Family History  Problem Relation Age of Onset  . Hyperlipidemia Mother   . Heart disease Father     Social History:  reports that he has quit smoking. His smoking use included cigarettes. He has never used smokeless tobacco. He reports that he does not drink alcohol and does not use drugs.  ROS: Pertinent ROS in HPI  Physical Exam: BP (!) 150/84   Pulse (!) 55   Ht 6' (1.829 m)   Wt 160 lb (72.6 kg)   BMI 21.70 kg/m   Constitutional:  Well nourished. Alert and oriented, No acute distress. HEENT: [place] AT, mask in place.  Trachea midline Cardiovascular: No clubbing, cyanosis, or edema. Respiratory: Normal respiratory effort, no increased work of breathing. GU: No CVA tenderness.  No bladder fullness or masses.  Patient with circumcised phallus.  Urethral meatus is patent.  No penile discharge. No penile lesions or rashes. Scrotum without lesions, cysts, rashes and/or edema.  Testicles are located scrotally bilaterally. No masses are appreciated in the testicles. Left and right epididymis are normal. Rectal: Patient with  normal sphincter tone. Anus and perineum without scarring or rashes. No rectal masses are appreciated. Prostate is approximately 50 grams, right lobe greater than left lobe, no nodules are appreciated. Seminal vesicles could not be palpated Skin: No rashes, bruises or suspicious lesions. Lymph: No inguinal adenopathy. Neurologic: Grossly  intact, no focal deficits, moving all 4 extremities. Psychiatric: Normal mood and affect.  Laboratory Data: Component     Latest Ref Rng & Units 09/17/2017 09/18/2018 09/29/2019  Prostate Specific Ag, Serum     0.0 - 4.0 ng/mL 2.8 2.6 3.3    Lab Results  Component Value Date   WBC 11.0 (H) 10/21/2019   HGB 15.9 10/21/2019   HCT 44.9 10/21/2019   MCV 87.2 10/21/2019   PLT 152 10/21/2019    Lab Results  Component Value  Date   CREATININE 1.42 (H) 10/21/2019       Component Value Date/Time   CHOL 133 09/29/2019 0957   HDL 53 09/29/2019 0957   LDLCALC 65 09/29/2019 0957    Urinalysis Component     Latest Ref Rng & Units 01/02/2020  Specific Gravity, UA     1.005 - 1.030 1.025  pH, UA     5.0 - 7.5 5.5  Color, UA     Yellow Yellow  Appearance Ur     Clear Clear  Leukocytes,UA     Negative Negative  Protein,UA     Negative/Trace Negative  Glucose, UA     Negative Negative  Ketones, UA     Negative Negative  RBC, UA     Negative 1+ (A)  Bilirubin, UA     Negative Negative  Urobilinogen, Ur     0.2 - 1.0 mg/dL 0.2  Nitrite, UA     Negative Negative  Microscopic Examination      See below:   Component     Latest Ref Rng & Units 01/02/2020          WBC, UA     0 - 5 /hpf 0-5  RBC     0 - 2 /hpf 0-2  Epithelial Cells (non renal)     0 - 10 /hpf 0-10  Bacteria, UA     None seen/Few None seen     I have reviewed the labs.   Pertinent Imaging: Results for [name], [name] (MRN [id number]) as of 01/02/2020 11:56  Ref. Range 01/02/2020 11:48  Scan Result Unknown 75 ml   Narrative & Impression  CLINICAL DATA:  Nephrolithiasis, RIGHT ureteral calculus, hydronephrosis, post lithotripsy 10/23/2019  EXAM: RENAL / URINARY TRACT ULTRASOUND COMPLETE  COMPARISON:  CT abdomen and pelvis 10/21/2019  FINDINGS: Right Kidney:  Renal measurements: 11.3 x 4.6 x 4.9 cm = volume: 134 mL. Normal cortical thickness. Upper normal cortical echogenicity. No mass, hydronephrosis, or definite shadowing calcification. Tiny echogenic focus 3 mm diameter at inferior pole, nonshadowing, nonspecific.  Left Kidney:  Renal measurements: 12.1 x 5.5 x 5.0 cm = volume: 173 mL. Normal cortical thickness with upper normal echogenicity. No mass, hydronephrosis, or shadowing calcification.  Bladder:  Well distended, unremarkable. Prostate gland appears prominent at 3.9 x 3.3 x  5.7 cm in size (volume = 38 cm^3)  Other:  N/A  IMPRESSION: No evidence of renal mass or hydronephrosis.  Question 3 mm nonshadowing calculus versus artifact at inferior pole RIGHT kidney.  Mild prostatic enlargement.   Electronically Signed   By: Michael Everett M.D.   On: 12/22/2019 16:12   I have independently reviewed the films.  See HPI.      I have independently reviewed the films.    Assessment & Plan:    1. Right ureteral stone Discussed with patient undergoing a metabolic work-up, he defers at this time We discussed basic stone prevention techniques such as increasing fluids, citrate  douches and avoiding high oxalate food He is given a handout on oxalate content of foods  2. Right hydronephrosis hydronephrosis has resolved  3. Microscopic hematuria Resolved  4. BPH with LUTS Continue conservative management, avoiding bladder irritants and timed voiding's Most bothersome symptoms is/are frequency, urgency, weak urinary stream and urge incontinence Initiate 5 alpha reductase inhibitor (finasteride), discussed side effects RTC in 3 months for IPSS, PSA, PVR and exam   Return in about 3 months (around 04/03/2020) for psa, ipss and pvr.  These notes generated with voice recognition software. I apologize for typographical errors.  Michiel Cowboy, PA-C  [facility] [address]  [address] [place], [place] [address] [phone]

## 2020-01-02 ENCOUNTER — Other Ambulatory Visit: Payer: Self-pay

## 2020-01-02 ENCOUNTER — Ambulatory Visit: Payer: Medicare HMO | Admitting: Urology

## 2020-01-02 ENCOUNTER — Encounter: Payer: Self-pay | Admitting: Urology

## 2020-01-02 ENCOUNTER — Telehealth: Payer: Self-pay | Admitting: Urology

## 2020-01-02 VITALS — BP 150/84 | HR 55 | Ht 72.0 in | Wt 160.0 lb

## 2020-01-02 DIAGNOSIS — N132 Hydronephrosis with renal and ureteral calculous obstruction: Secondary | ICD-10-CM

## 2020-01-02 DIAGNOSIS — N201 Calculus of ureter: Secondary | ICD-10-CM | POA: Diagnosis not present

## 2020-01-02 DIAGNOSIS — N401 Enlarged prostate with lower urinary tract symptoms: Secondary | ICD-10-CM

## 2020-01-02 DIAGNOSIS — N138 Other obstructive and reflux uropathy: Secondary | ICD-10-CM

## 2020-01-02 DIAGNOSIS — R3129 Other microscopic hematuria: Secondary | ICD-10-CM

## 2020-01-02 LAB — BLADDER SCAN AMB NON-IMAGING: Scan Result: 75

## 2020-01-02 MED ORDER — TAMSULOSIN HCL 0.4 MG PO CAPS: 0.4000 mg | ORAL_CAPSULE | Freq: Every day | ORAL | 1 refills | Status: DC

## 2020-01-02 MED ORDER — FINASTERIDE 5 MG PO TABS
5.0000 mg | ORAL_TABLET | Freq: Every day | ORAL | 3 refills | Status: DC
Start: 2020-01-02 — End: 2020-01-02

## 2020-01-02 MED ORDER — FINASTERIDE 5 MG PO TABS: 5.0000 mg | ORAL_TABLET | Freq: Every day | ORAL | 3 refills | Status: DC

## 2020-01-02 NOTE — Telephone Encounter (Signed)
Would you call in his prescriptions for tamsulosin and finasteride to his pharmacy?  There is a systemwide damage with a scribe.

## 2020-01-02 NOTE — Telephone Encounter (Signed)
Sent prescriptions to pharmacy.

## 2020-01-03 LAB — URINALYSIS, COMPLETE
Bilirubin, UA: NEGATIVE
Glucose, UA: NEGATIVE
Ketones, UA: NEGATIVE
Leukocytes,UA: NEGATIVE
Nitrite, UA: NEGATIVE
Protein,UA: NEGATIVE
Specific Gravity, UA: 1.025 (ref 1.005–1.030)
Urobilinogen, Ur: 0.2 mg/dL (ref 0.2–1.0)
pH, UA: 5.5 (ref 5.0–7.5)

## 2020-01-03 LAB — MICROSCOPIC EXAMINATION: Bacteria, UA: NONE SEEN

## 2020-01-14 ENCOUNTER — Ambulatory Visit: Payer: Medicare HMO

## 2020-01-21 ENCOUNTER — Ambulatory Visit (INDEPENDENT_AMBULATORY_CARE_PROVIDER_SITE_OTHER): Payer: Medicare HMO

## 2020-01-21 ENCOUNTER — Other Ambulatory Visit: Payer: Self-pay

## 2020-01-21 DIAGNOSIS — G4733 Obstructive sleep apnea (adult) (pediatric): Secondary | ICD-10-CM | POA: Diagnosis not present

## 2020-01-21 NOTE — Progress Notes (Signed)
95 percentile pressure 13.1   95th percentile leak 1.3   apnea index 1.4 /hr  apnea-hypopnea index  1.9 /hr   total days used  >4 hr 90 days  total days used <4 hr 0 days  Total compliance 100 percent  He is doing great no problems or questions at this time.

## 2020-02-02 ENCOUNTER — Other Ambulatory Visit: Payer: Self-pay

## 2020-02-02 ENCOUNTER — Encounter: Payer: Self-pay | Admitting: Internal Medicine

## 2020-02-02 ENCOUNTER — Ambulatory Visit: Payer: Medicare HMO | Admitting: Internal Medicine

## 2020-02-02 VITALS — BP 120/80 | HR 57 | Temp 97.9°F | Resp 16 | Ht 72.0 in | Wt 162.0 lb

## 2020-02-02 DIAGNOSIS — G4733 Obstructive sleep apnea (adult) (pediatric): Secondary | ICD-10-CM | POA: Diagnosis not present

## 2020-02-02 DIAGNOSIS — R001 Bradycardia, unspecified: Secondary | ICD-10-CM

## 2020-02-02 DIAGNOSIS — I5189 Other ill-defined heart diseases: Secondary | ICD-10-CM

## 2020-02-02 NOTE — Progress Notes (Signed)
[facility] [address] [place], [place] [address]  Pulmonary Sleep Medicine   Office Visit Note  Patient Name: Michael Everett DOB: Dec 14, 1945 MRN [id number]  Date of Service: 02/02/2020  Complaints/HPI: OSA compliance had renal stones lithotripsy he is actually doing quite well with the sleep apnea.  He has been using the CPAP device has had excellent compliance.  No issues with mask.  Also been getting sinus congestion or infection.  Denies any chest pain no palpitations no shortness of breath no cough is noted.  He is being seen regularly by the CPAP provider and he is doing well  ROS  General: (-) fever, (-) chills, (-) night sweats, (-) weakness Skin: (-) rashes, (-) itching,. Eyes: (-) visual changes, (-) redness, (-) itching. Nose and Sinuses: (-) nasal stuffiness or itchiness, (-) postnasal drip, (-) nosebleeds, (-) sinus trouble. Mouth and Throat: (-) sore throat, (-) hoarseness. Neck: (-) swollen glands, (-) enlarged thyroid, (-) neck pain. Respiratory: - cough, (-) bloody sputum, - shortness of breath, - wheezing. Cardiovascular: - ankle swelling, (-) chest pain. Lymphatic: (-) lymph node enlargement. Neurologic: (-) numbness, (-) tingling. Psychiatric: (-) anxiety, (-) depression   Current Medication: Outpatient Encounter Medications as of 02/02/2020  Medication Sig  . aspirin EC 81 MG tablet Take 81 mg by mouth daily.  Marland Kitchen atorvastatin (LIPITOR) 10 MG tablet TAKE 1 TABLET (10 MG TOTAL) BY MOUTH DAILY AT 6 PM.  . finasteride (PROSCAR) 5 MG tablet Take 1 tablet (5 mg total) by mouth daily.  . fluticasone (FLONASE) 50 MCG/ACT nasal spray PLACE 1 SPRAY INTO BOTH NOSTRILS DAILY.  Marland Kitchen lisinopril (ZESTRIL) 40 MG tablet TAKE 1 TABLET BY MOUTH EVERY DAY FOR BLOOD PRESSURE CONTROL  . tamsulosin (FLOMAX) 0.4 MG CAPS capsule Take 1 capsule (0.4 mg total) by mouth daily.   No facility-administered encounter medications on file as of 02/02/2020.    Surgical  History: Past Surgical History:  Procedure Laterality Date  . cataract and lens implant right eye  2015  . CATARACT EXTRACTION, BILATERAL Bilateral 10/2013  . EXTRACORPOREAL SHOCK WAVE LITHOTRIPSY Right 10/23/2019   Procedure: EXTRACORPOREAL SHOCK WAVE LITHOTRIPSY (ESWL);  Surgeon: Riki Altes, MD;  Location: [facility] ORS;  Service: Urology;  Laterality: Right;  Marland Kitchen VASECTOMY      Medical History: Past Medical History:  Diagnosis Date  . Hyperlipidemia   . Hypertension     Family History: Family History  Problem Relation Age of Onset  . Hyperlipidemia Mother   . Heart disease Father     Social History: Social History   Socioeconomic History  . Marital status: Married    Spouse name: Not on file  . Number of children: Not on file  . Years of education: Not on file  . Highest education level: Not on file  Occupational History  . Not on file  Tobacco Use  . Smoking status: Former Smoker    Types: Cigarettes  . Smokeless tobacco: Never Used  Vaping Use  . Vaping Use: Never used  Substance and Sexual Activity  . Alcohol use: No  . Drug use: No  . Sexual activity: Not on file  Other Topics Concern  . Not on file  Social History Narrative  . Not on file   Social Determinants of Health   Financial Resource Strain:   . Difficulty of Paying Living Expenses: Not on file  Food Insecurity:   . Worried About Programme researcher, broadcasting/film/video in the Last Year: Not on file  .  Ran Out of Food in the Last Year: Not on file  Transportation Needs:   . Lack of Transportation (Medical): Not on file  . Lack of Transportation (Non-Medical): Not on file  Physical Activity:   . Days of Exercise per Week: Not on file  . Minutes of Exercise per Session: Not on file  Stress:   . Feeling of Stress : Not on file  Social Connections:   . Frequency of Communication with Friends and Family: Not on file  . Frequency of Social Gatherings with Friends and Family: Not on file  . Attends Religious  Services: Not on file  . Active Member of Clubs or Organizations: Not on file  . Attends Banker Meetings: Not on file  . Marital Status: Not on file  Intimate Partner Violence:   . Fear of Current or Ex-Partner: Not on file  . Emotionally Abused: Not on file  . Physically Abused: Not on file  . Sexually Abused: Not on file    Vital Signs: Blood pressure 120/80, pulse (!) 57, temperature 97.9 F (36.6 C), resp. rate 16, height 6' (1.829 m), weight 162 lb (73.5 kg), SpO2 94 %.  Examination: General Appearance: The patient is well-developed, well-nourished, and in no distress. Skin: Gross inspection of skin unremarkable. Head: normocephalic, no gross deformities. Eyes: no gross deformities noted. ENT: ears appear grossly normal no exudates. Neck: Supple. No thyromegaly. No LAD. Respiratory: no rhonchi noted. Cardiovascular: Normal S1 and S2 without murmur or rub. Extremities: No cyanosis. pulses are equal. Neurologic: Alert and oriented. No involuntary movements.  LABS: Recent Results (from the past 2160 hour(s))  Calculi, with Photograph (to Clinical Lab)     Status: None   Collection Time: 11/07/19 11:04 AM  Result Value Ref Range   Source Calculi Comment     Comment: Not provided   Color Calculi Wallace Cullens    Size Calculi 4x3 mm    Comment: Multiple pieces received.  Dimensions of the largest piece reported.    Weight Calculi 82 mg   Composition Calculi Comment     Comment: Percentage (Represents the % composition)   Calcium Oxalate Monohydrate 70 %   Calcium Oxalate Dihydrate 30 %   Photo Calculi Comment     Comment: Photograph will follow under a separate cover   Comment Calculi 3 Comment     Comment: Physician questions regarding Calculi Analysis contact [facility] at: [phone].    Please Note: Comment     Comment: Calculi report will follow via computer, mail or courier delivery.    DISCLAIMER: Comment     Comment: This test was developed and its  performance characteristics determined by [facility].  It has not been cleared or approved by the Food and Drug Administration.   Urinalysis, Complete     Status: Abnormal   Collection Time: 01/02/20 11:24 AM  Result Value Ref Range   Specific Gravity, UA 1.025 1.005 - 1.030   pH, UA 5.5 5.0 - 7.5   Color, UA Yellow Yellow   Appearance Ur Clear Clear   Leukocytes,UA Negative Negative   Protein,UA Negative Negative/Trace   Glucose, UA Negative Negative   Ketones, UA Negative Negative   RBC, UA 1+ (A) Negative   Bilirubin, UA Negative Negative   Urobilinogen, Ur 0.2 0.2 - 1.0 mg/dL   Nitrite, UA Negative Negative   Microscopic Examination See below:   Microscopic Examination     Status: None   Collection Time: 01/02/20 11:24 AM   Urine  Result Value Ref Range   WBC, UA 0-5 0 - 5 /hpf   RBC 0-2 0 - 2 /hpf   Epithelial Cells (non renal) 0-10 0 - 10 /hpf   Bacteria, UA None seen None seen/Few  BLADDER SCAN AMB NON-IMAGING     Status: None   Collection Time: 01/02/20 11:48 AM  Result Value Ref Range   Scan Result 75 ml     Radiology: US RENAL  Result Date: 12/22/2019 CLINICAL DATA:  Nephrolithiasis, RIGHT ureteral calculus, hydronephrosis, post lithotripsy 10/23/2019 EXAM: RENAL / URINARY TRACT ULTRASOUND COMPLETE COMPARISON:  CT abdomen and pelvis 10/21/2019 FINDINGS: Right Kidney: Renal measurements: 11.3 x 4.6 x 4.9 cm = volume: 134 mL. Normal cortical thickness. Upper normal cortical echogenicity. No mass, hydronephrosis, or definite shadowing calcification. Tiny echogenic focus 3 mm diameter at inferior pole, nonshadowing, nonspecific. Left Kidney: Renal measurements: 12.1 x 5.5 x 5.0 cm = volume: 173 mL. Normal cortical thickness with upper normal echogenicity. No mass, hydronephrosis, or shadowing calcification. Bladder: Well distended, unremarkable. Prostate gland appears prominent at 3.9 x 3.3 x 5.7 cm in size (volume = 38 cm^3) Other: N/A IMPRESSION: No evidence of renal mass or  hydronephrosis. Question 3 mm nonshadowing calculus versus artifact at inferior pole RIGHT kidney. Mild prostatic enlargement. Electronically Signed   By: Ulyses Southward M.D.   On: 12/22/2019 16:12    No results found.  No results found.    Assessment and Plan: Patient Active Problem List   Diagnosis Date Noted  . Encounter for general adult medical examination with abnormal findings 09/29/2019  . Bradycardia 03/29/2019  . OSA on CPAP 03/29/2019  . Diastolic dysfunction 11/17/2018  . Snoring 11/17/2018  . Special screening for malignant neoplasm of prostate 09/17/2017  . Need for vaccination against Streptococcus pneumoniae using pneumococcal conjugate vaccine 13 09/17/2017  . Dysuria 09/17/2017  . Hypertension 04/29/2017  . Hyperlipidemia 04/29/2017  . Cervicalgia 04/29/2017    1. OSA overall doing well with being on CPAP he will continue him on the 7 pressure levels.  Download was reviewed he has excellent compliance 2. Bradycardia asymptomatic heart rate was 57 today he will continue to monitor otherwise appears to be comfortable 3. Diastolic dysfunction Grade II compensated no issues may need a follow-up echocardiogram at some point  General Counseling: I have discussed the findings of the evaluation and examination with Alden Server.  I have also discussed any further diagnostic evaluation thatmay be needed or ordered today. Ranulfo verbalizes understanding of the findings of todays visit. We also reviewed his medications today and discussed drug interactions and side effects including but not limited excessive drowsiness and altered mental states. We also discussed that there is always a risk not just to him but also people around him. he has been encouraged to call the office with any questions or concerns that should arise related to todays visit.  No orders of the defined types were placed in this encounter.    Time spent: 18  I have personally obtained a history, examined the  patient, evaluated laboratory and imaging results, formulated the assessment and plan and placed orders.    Yevonne Pax, MD [facility] Pulmonary and Critical Care Sleep medicine

## 2020-02-02 NOTE — Patient Instructions (Signed)

## 2020-03-22 ENCOUNTER — Other Ambulatory Visit: Payer: Self-pay | Admitting: Internal Medicine

## 2020-03-30 ENCOUNTER — Other Ambulatory Visit: Payer: Self-pay | Admitting: Urology

## 2020-03-30 ENCOUNTER — Encounter: Payer: Self-pay | Admitting: Nurse Practitioner

## 2020-03-30 ENCOUNTER — Ambulatory Visit: Payer: Medicare HMO | Admitting: Nurse Practitioner

## 2020-03-30 VITALS — BP 141/87 | HR 60 | Temp 97.5°F | Resp 16 | Ht 72.0 in | Wt 166.4 lb

## 2020-03-30 DIAGNOSIS — E782 Mixed hyperlipidemia: Secondary | ICD-10-CM | POA: Diagnosis not present

## 2020-03-30 DIAGNOSIS — G4733 Obstructive sleep apnea (adult) (pediatric): Secondary | ICD-10-CM | POA: Diagnosis not present

## 2020-03-30 DIAGNOSIS — Z9989 Dependence on other enabling machines and devices: Secondary | ICD-10-CM

## 2020-03-30 DIAGNOSIS — N401 Enlarged prostate with lower urinary tract symptoms: Secondary | ICD-10-CM

## 2020-03-30 DIAGNOSIS — I10 Essential (primary) hypertension: Secondary | ICD-10-CM | POA: Diagnosis not present

## 2020-03-30 DIAGNOSIS — N138 Other obstructive and reflux uropathy: Secondary | ICD-10-CM

## 2020-03-30 NOTE — Progress Notes (Signed)
[facility] [address] [place], [place] [address]  Internal MEDICINE  Office Visit Note  Patient Name: Michael Everett  [id number]  [id number]  Date of Service: 03/30/2020  Chief Complaint  Patient presents with  . Follow-up  . Hyperlipidemia  . Hypertension    The patient is here for follow up visit.  -blood pressure well managed.  -recent treatment for renal stones. Had to have lithotripsy. Following up with urology.  -had booster for COVID 19 in 11/2019. -most recent fasting labs done 09/2019 and were normal.       Current Medication: Outpatient Encounter Medications as of 03/30/2020  Medication Sig  . aspirin EC 81 MG tablet Take 81 mg by mouth daily.  Marland Kitchen atorvastatin (LIPITOR) 10 MG tablet TAKE 1 TABLET (10 MG TOTAL) BY MOUTH DAILY AT 6 PM.  . finasteride (PROSCAR) 5 MG tablet Take 1 tablet (5 mg total) by mouth daily.  . fluticasone (FLONASE) 50 MCG/ACT nasal spray PLACE 1 SPRAY INTO BOTH NOSTRILS DAILY.  Marland Kitchen lisinopril (ZESTRIL) 40 MG tablet TAKE 1 TABLET BY MOUTH EVERY DAY FOR BLOOD PRESSURE CONTROL  . tamsulosin (FLOMAX) 0.4 MG CAPS capsule Take 1 capsule (0.4 mg total) by mouth daily.   No facility-administered encounter medications on file as of 03/30/2020.    Surgical History: Past Surgical History:  Procedure Laterality Date  . cataract and lens implant right eye  2015  . CATARACT EXTRACTION, BILATERAL Bilateral 10/2013  . EXTRACORPOREAL SHOCK WAVE LITHOTRIPSY Right 10/23/2019   Procedure: EXTRACORPOREAL SHOCK WAVE LITHOTRIPSY (ESWL);  Surgeon: Riki Altes, MD;  Location: [facility] ORS;  Service: Urology;  Laterality: Right;  Marland Kitchen VASECTOMY      Medical History: Past Medical History:  Diagnosis Date  . Hyperlipidemia   . Hypertension     Family History: Family History  Problem Relation Age of Onset  . Hyperlipidemia Mother   . Heart disease Father     Social History   Socioeconomic History  . Marital status: Married    Spouse name:  Not on file  . Number of children: Not on file  . Years of education: Not on file  . Highest education level: Not on file  Occupational History  . Not on file  Tobacco Use  . Smoking status: Former Smoker    Types: Cigarettes  . Smokeless tobacco: Never Used  Vaping Use  . Vaping Use: Never used  Substance and Sexual Activity  . Alcohol use: No  . Drug use: No  . Sexual activity: Not on file  Other Topics Concern  . Not on file  Social History Narrative  . Not on file   Social Determinants of Health   Financial Resource Strain: Not on file  Food Insecurity: Not on file  Transportation Needs: Not on file  Physical Activity: Not on file  Stress: Not on file  Social Connections: Not on file  Intimate Partner Violence: Not on file      Review of Systems  Constitutional: Negative for activity change, chills, fatigue and unexpected weight change.  HENT: Negative for congestion, postnasal drip, rhinorrhea, sneezing and sore throat.   Respiratory: Negative for cough, chest tightness and shortness of breath.   Cardiovascular: Negative for chest pain and palpitations.  Gastrointestinal: Negative for abdominal pain, constipation, diarrhea, nausea and vomiting.  Endocrine: Negative for cold intolerance, heat intolerance and polyuria.  Musculoskeletal: Negative for arthralgias, back pain, joint swelling and neck pain.  Skin: Negative for rash.  Neurological: Negative.  Negative  for tremors and numbness.  Hematological: Negative for adenopathy. Does not bruise/bleed easily.  Psychiatric/Behavioral: Negative for behavioral problems (Depression), sleep disturbance and suicidal ideas. The patient is not nervous/anxious.     Today's Vitals   03/30/20 0825  BP: (!) 141/87  Pulse: 60  Resp: 16  Temp: (!) 97.5 F (36.4 C)  SpO2: 95%  Weight: 166 lb 6.4 oz (75.5 kg)  Height: 6' (1.829 m)   Body mass index is 22.57 kg/m.  Physical Exam Vitals and nursing note reviewed.   Constitutional:      General: He is not in acute distress.    Appearance: Normal appearance. He is well-developed and well-nourished. He is not diaphoretic.  HENT:     Head: Normocephalic and atraumatic.     Nose: Nose normal.     Mouth/Throat:     Mouth: Oropharynx is clear and moist.     Pharynx: No oropharyngeal exudate.  Eyes:     Extraocular Movements: EOM normal.     Pupils: Pupils are equal, round, and reactive to light.  Neck:     Thyroid: No thyromegaly.     Vascular: No carotid bruit or JVD.     Trachea: No tracheal deviation.  Cardiovascular:     Rate and Rhythm: Normal rate and regular rhythm.     Heart sounds: Normal heart sounds. No murmur heard. No friction rub. No gallop.   Pulmonary:     Effort: Pulmonary effort is normal. No respiratory distress.     Breath sounds: Normal breath sounds. No wheezing or rales.  Chest:     Chest wall: No tenderness.  Abdominal:     Palpations: Abdomen is soft.  Musculoskeletal:        General: Normal range of motion.     Cervical back: Normal range of motion and neck supple.  Lymphadenopathy:     Cervical: No cervical adenopathy.  Skin:    General: Skin is warm and dry.  Neurological:     Mental Status: He is alert and oriented to person, place, and time.     Cranial Nerves: No cranial nerve deficit.  Psychiatric:        Mood and Affect: Mood and affect and mood normal.        Behavior: Behavior normal.        Thought Content: Thought content normal.        Judgment: Judgment normal.    Assessment/Plan: 1. Primary hypertension Stable. Continue bp medication as prescribed   2. Mixed hyperlipidemia Most recent lipid panel WNL. Continue atorvastatin as prescribed   3. OSA on CPAP Continue regular visits with Dr. Freda Munro for CPAP management.  General Counseling: Michael Everett understanding of the findings of todays visit and agrees with plan of treatment. I have discussed any further diagnostic evaluation  that may be needed or ordered today. We also reviewed his medications today. he has been encouraged to call the office with any questions or concerns that should arise related to todays visit.   This patient was seen by Vincent Gros FNP Collaboration with Dr Lyndon Code as a part of collaborative care agreement   Total time spent: 25 Minutes   Time spent includes review of chart, medications, test results, and follow up plan with the patient.      Dr Lyndon Code Internal medicine

## 2020-04-07 ENCOUNTER — Other Ambulatory Visit: Payer: Self-pay

## 2020-04-07 DIAGNOSIS — N138 Other obstructive and reflux uropathy: Secondary | ICD-10-CM

## 2020-04-07 DIAGNOSIS — N401 Enlarged prostate with lower urinary tract symptoms: Secondary | ICD-10-CM

## 2020-04-09 ENCOUNTER — Other Ambulatory Visit: Payer: Medicare HMO

## 2020-04-09 ENCOUNTER — Other Ambulatory Visit: Payer: Self-pay

## 2020-04-09 DIAGNOSIS — N138 Other obstructive and reflux uropathy: Secondary | ICD-10-CM

## 2020-04-09 DIAGNOSIS — N401 Enlarged prostate with lower urinary tract symptoms: Secondary | ICD-10-CM

## 2020-04-10 LAB — PSA: Prostate Specific Ag, Serum: 1.1 ng/mL (ref 0.0–4.0)

## 2020-04-15 NOTE — Progress Notes (Addendum)
04/16/2020 8:58 AM   [name] [birth year] [id number]  Referring provider: Carlean Jews, NP [address] [place],  [place] [address]  Chief Complaint  Patient presents with   Benign Prostatic Hypertrophy   Urological history 1.  Nephrolithiasis - ESWL on 10/23/2019 for 7 mm right proximal ureteral calculus  - RUS 12/2019 no hydro - stone composition 70% calcium oxalate monohydrate and 30% calcium oxalate dihydrate  2. BPH with LU TS - PSA 1.1 (2.2) in 03/2020 - I PSS 6/2 - PVR 64 mL - managed with finasteride 5 mg daily   HPI: JUANCARLOS CRESCENZO is a 75 y.o. who presents today for a three months follow up after a trial of finasteride.   He states he has noted that his is here to start his urinary stream and his urinary stream is stronger.  Patient denies any modifying or aggravating factors.  Patient denies any gross hematuria, dysuria or suprapubic/flank pain.  Patient denies any fevers, chills, nausea or vomiting.    IPSS    Row Name 04/16/20 0800         International Prostate Symptom Score   How often have you had the sensation of not emptying your bladder? Less than 1 in 5     How often have you had to urinate less than every two hours? Less than 1 in 5 times     How often have you found you stopped and started again several times when you urinated? Less than 1 in 5 times     How often have you found it difficult to postpone urination? Less than 1 in 5 times     How often have you had a weak urinary stream? Less than 1 in 5 times     How often have you had to strain to start urination? Less than 1 in 5 times     How many times did you typically get up at night to urinate? None     Total IPSS Score 6           Quality of Life due to urinary symptoms   If you were to spend the rest of your life with your urinary condition just the way it is now how would you feel about that? Mostly Satisfied            Score:  1-7 Mild 8-19  Moderate 20-35 Severe  PMH: Past Medical History:  Diagnosis Date   Hyperlipidemia    Hypertension     Surgical History: Past Surgical History:  Procedure Laterality Date   cataract and lens implant right eye  2015   CATARACT EXTRACTION, BILATERAL Bilateral 10/2013   EXTRACORPOREAL SHOCK WAVE LITHOTRIPSY Right 10/23/2019   Procedure: EXTRACORPOREAL SHOCK WAVE LITHOTRIPSY (ESWL);  Surgeon: Riki Altes, MD;  Location: [facility] ORS;  Service: Urology;  Laterality: Right;   VASECTOMY      Home Medications:  Current Outpatient Medications on File Prior to Visit  Medication Sig Dispense Refill   aspirin EC 81 MG tablet Take 81 mg by mouth daily.     atorvastatin (LIPITOR) 10 MG tablet TAKE 1 TABLET (10 MG TOTAL) BY MOUTH DAILY AT 6 PM. 90 tablet 2   finasteride (PROSCAR) 5 MG tablet Take 1 tablet (5 mg total) by mouth daily. 90 tablet 3   fluticasone (FLONASE) 50 MCG/ACT nasal spray PLACE 1 SPRAY INTO BOTH NOSTRILS DAILY. 48 mL 3   lisinopril (ZESTRIL) 40 MG tablet TAKE 1 TABLET  BY MOUTH EVERY DAY FOR BLOOD PRESSURE CONTROL 90 tablet 2   tamsulosin (FLOMAX) 0.4 MG CAPS capsule TAKE 1 CAPSULE BY MOUTH EVERY DAY 90 capsule 1   No current facility-administered medications on file prior to visit.    Allergies: No Known Allergies  Family History: Family History  Problem Relation Age of Onset   Hyperlipidemia Mother    Heart disease Father     Social History:  reports that he has quit smoking. His smoking use included cigarettes. He has never used smokeless tobacco. He reports that he does not drink alcohol and does not use drugs.  ROS: Pertinent ROS in HPI  Physical Exam: BP (!) 167/93    Pulse (!) 55    Ht 6' (1.829 m)    Wt 165 lb (74.8 kg)    BMI 22.38 kg/m   Constitutional:  Well nourished. Alert and oriented, No acute distress. HEENT: [place] AT, mask in place.  Trachea midline Cardiovascular: No clubbing, cyanosis, or edema. Respiratory: Normal respiratory  effort, no increased work of breathing. Neurologic: Grossly intact, no focal deficits, moving all 4 extremities. Psychiatric: Normal mood and affect.  Laboratory Data: Component     Latest Ref Rng & Units 09/17/2017 09/18/2018 09/29/2019 04/09/2020  Prostate Specific Ag, Serum     0.0 - 4.0 ng/mL 2.8 2.6 3.3 1.1  I have reviewed the labs.   Pertinent Imaging: Results for DONAVYN, FECHERERNIE" (MRN [id number]) as of 04/16/2020 08:56  Ref. Range 04/16/2020 08:25  Scan Result Unknown 64    Assessment & Plan:    1. BPH with LUTS IPSS score is 6/2 Continue conservative management, avoiding bladder irritants and timed voiding's Continue tamsulosin 0.4 mg daily and finasteride 5 mg daily RTC in 12 months for IPSS, PSA, PVR and exam   2. Nephrolithiasis No episodes of passage of fragments, flank pain or gross hematuria RTC in one year for KUB   Return in about 1 year (around 04/16/2021) for KUB, PSA, I PSS and PVR .  These notes generated with voice recognition software. I apologize for typographical errors.  Michiel Cowboy, PA-C  [facility] [address]  [address] [place], [place] [address] [phone]

## 2020-04-16 ENCOUNTER — Ambulatory Visit: Payer: Medicare HMO | Admitting: Urology

## 2020-04-16 ENCOUNTER — Encounter: Payer: Self-pay | Admitting: Urology

## 2020-04-16 ENCOUNTER — Other Ambulatory Visit: Payer: Self-pay

## 2020-04-16 VITALS — BP 167/93 | HR 55 | Ht 72.0 in | Wt 165.0 lb

## 2020-04-16 DIAGNOSIS — N138 Other obstructive and reflux uropathy: Secondary | ICD-10-CM | POA: Diagnosis not present

## 2020-04-16 DIAGNOSIS — N401 Enlarged prostate with lower urinary tract symptoms: Secondary | ICD-10-CM | POA: Diagnosis not present

## 2020-04-16 DIAGNOSIS — N200 Calculus of kidney: Secondary | ICD-10-CM

## 2020-04-16 LAB — BLADDER SCAN AMB NON-IMAGING: Scan Result: 64

## 2020-07-21 ENCOUNTER — Other Ambulatory Visit: Payer: Self-pay

## 2020-07-21 ENCOUNTER — Ambulatory Visit: Payer: Medicare HMO

## 2020-07-21 DIAGNOSIS — G4733 Obstructive sleep apnea (adult) (pediatric): Secondary | ICD-10-CM | POA: Diagnosis not present

## 2020-07-21 NOTE — Progress Notes (Signed)
95 percentile pressure 13.2   95th percentile leak 0.2   apnea index 1.2 /hr  apnea-hypopnea index  1.8 /hr   total days used  >4 hr 29 days  total days used <4 hr 0 days  Total compliance 97 percent  He is doing good wearing exchanged his cpap for new one his was making loud whine noise

## 2020-09-07 ENCOUNTER — Other Ambulatory Visit: Payer: Self-pay

## 2020-09-07 MED ORDER — FLUTICASONE PROPIONATE 50 MCG/ACT NA SUSP: 1.0000 | Freq: Every day | NASAL | 3 refills | Status: DC

## 2020-09-28 ENCOUNTER — Other Ambulatory Visit: Payer: Self-pay | Admitting: Nurse Practitioner

## 2020-09-29 LAB — COMPREHENSIVE METABOLIC PANEL
ALT: 22 IU/L (ref 0–44)
AST: 22 IU/L (ref 0–40)
Albumin/Globulin Ratio: 1.6 (ref 1.2–2.2)
Albumin: 4.2 g/dL (ref 3.7–4.7)
Alkaline Phosphatase: 85 IU/L (ref 44–121)
BUN/Creatinine Ratio: 19 (ref 10–24)
BUN: 21 mg/dL (ref 8–27)
Bilirubin Total: 0.7 mg/dL (ref 0.0–1.2)
CO2: 24 mmol/L (ref 20–29)
Calcium: 9.7 mg/dL (ref 8.6–10.2)
Chloride: 102 mmol/L (ref 96–106)
Creatinine, Ser: 1.1 mg/dL (ref 0.76–1.27)
Globulin, Total: 2.6 g/dL (ref 1.5–4.5)
Glucose: 92 mg/dL (ref 65–99)
Potassium: 4.2 mmol/L (ref 3.5–5.2)
Sodium: 140 mmol/L (ref 134–144)
Total Protein: 6.8 g/dL (ref 6.0–8.5)
eGFR: 69 mL/min/{1.73_m2} (ref >59)

## 2020-09-29 LAB — LIPID PANEL WITH LDL/HDL RATIO
Cholesterol, Total: 122 mg/dL (ref 100–199)
HDL: 51 mg/dL (ref >39)
LDL Chol Calc (NIH): 59 mg/dL (ref 0–99)
LDL/HDL Ratio: 1.2 ratio (ref 0.0–3.6)
Triglycerides: 55 mg/dL (ref 0–149)
VLDL Cholesterol Cal: 12 mg/dL (ref 5–40)

## 2020-09-29 LAB — CBC
Hematocrit: 46 % (ref 37.5–51.0)
Hemoglobin: 15.3 g/dL (ref 13.0–17.7)
MCH: 30 pg (ref 26.6–33.0)
MCHC: 33.3 g/dL (ref 31.5–35.7)
MCV: 90 fL (ref 79–97)
Platelets: 142 10*3/uL — ABNORMAL LOW (ref 150–450)
RBC: 5.1 x10E6/uL (ref 4.14–5.80)
RDW: 13.4 % (ref 11.6–15.4)
WBC: 5 10*3/uL (ref 3.4–10.8)

## 2020-09-29 LAB — TSH: TSH: 3.18 u[IU]/mL (ref 0.450–4.500)

## 2020-09-29 LAB — PSA: Prostate Specific Ag, Serum: 0.9 ng/mL (ref 0.0–4.0)

## 2020-09-29 LAB — T4, FREE: Free T4: 1.25 ng/dL (ref 0.82–1.77)

## 2020-09-29 NOTE — Progress Notes (Signed)
Labs came to me, but upcoming appointment with you.

## 2020-10-08 ENCOUNTER — Ambulatory Visit (INDEPENDENT_AMBULATORY_CARE_PROVIDER_SITE_OTHER): Payer: Medicare HMO | Admitting: Physician Assistant

## 2020-10-08 ENCOUNTER — Encounter: Payer: Self-pay | Admitting: Physician Assistant

## 2020-10-08 ENCOUNTER — Other Ambulatory Visit: Payer: Self-pay

## 2020-10-08 DIAGNOSIS — Z0001 Encounter for general adult medical examination with abnormal findings: Secondary | ICD-10-CM

## 2020-10-08 DIAGNOSIS — I10 Essential (primary) hypertension: Secondary | ICD-10-CM | POA: Diagnosis not present

## 2020-10-08 DIAGNOSIS — Z9989 Dependence on other enabling machines and devices: Secondary | ICD-10-CM

## 2020-10-08 DIAGNOSIS — R300 Dysuria: Secondary | ICD-10-CM

## 2020-10-08 DIAGNOSIS — D696 Thrombocytopenia, unspecified: Secondary | ICD-10-CM

## 2020-10-08 DIAGNOSIS — E782 Mixed hyperlipidemia: Secondary | ICD-10-CM

## 2020-10-08 DIAGNOSIS — G4733 Obstructive sleep apnea (adult) (pediatric): Secondary | ICD-10-CM | POA: Diagnosis not present

## 2020-10-08 DIAGNOSIS — Z23 Encounter for immunization: Secondary | ICD-10-CM

## 2020-10-08 MED ORDER — TETANUS-DIPHTH-ACELL PERTUSSIS 5-2.5-18.5 LF-MCG/0.5 IM SUSP: 0.5000 mL | Freq: Once | INTRAMUSCULAR | 0 refills | Status: AC

## 2020-10-08 MED ORDER — ZOSTER VAC RECOMB ADJUVANTED 50 MCG/0.5ML IM SUSR: 0.5000 mL | Freq: Once | INTRAMUSCULAR | 1 refills | Status: AC

## 2020-10-08 NOTE — Progress Notes (Signed)
[facility], [place] [address]  Internal MEDICINE  Office Visit Note  Patient Name: Michael Everett  [id number]  [id number]  Date of Service: 10/13/2020  Chief Complaint  Patient presents with   Medicare Wellness   Hypertension    Patient takes BP meds at night     HPI Pt is here for routine health maintenance examination and has no complaints today -Reports his appetite is good -Wears cpap nightly and sleeps well, 8-9 hours per night. -BP well controlled on lisinopril which he prefers to take at night -he is also taking is lipitor nightly and is tolerating this  -He is due for shingles and tdap vaccinations which he will have done at the pharmacy -Labs reviewed and are normal except for mildly low platelets  Current Medication: Outpatient Encounter Medications as of 10/08/2020  Medication Sig   aspirin EC 81 MG tablet Take 81 mg by mouth daily.   atorvastatin (LIPITOR) 10 MG tablet TAKE 1 TABLET (10 MG TOTAL) BY MOUTH DAILY AT 6 PM.   finasteride (PROSCAR) 5 MG tablet Take 1 tablet (5 mg total) by mouth daily.   fluticasone (FLONASE) 50 MCG/ACT nasal spray Place 1 spray into both nostrils daily.   lisinopril (ZESTRIL) 40 MG tablet TAKE 1 TABLET BY MOUTH EVERY DAY FOR BLOOD PRESSURE CONTROL   tamsulosin (FLOMAX) 0.4 MG CAPS capsule TAKE 1 CAPSULE BY MOUTH EVERY DAY   [DISCONTINUED] Tdap (BOOSTRIX) 5-2.5-18.5 LF-MCG/0.5 injection Inject 0.5 mLs into the muscle once.   [DISCONTINUED] Zoster Vaccine Adjuvanted [facility]) injection Inject 0.5 mLs into the muscle once.   [EXPIRED] Tdap (BOOSTRIX) 5-2.5-18.5 LF-MCG/0.5 injection Inject 0.5 mLs into the muscle once for 1 dose.   [EXPIRED] Zoster Vaccine Adjuvanted [facility]) injection Inject 0.5 mLs into the muscle once for 1 dose.   No facility-administered encounter medications on file as of 10/08/2020.    Surgical History: Past Surgical History:  Procedure Laterality Date   cataract and  lens implant right eye  2015   CATARACT EXTRACTION, BILATERAL Bilateral 10/2013   EXTRACORPOREAL SHOCK WAVE LITHOTRIPSY Right 10/23/2019   Procedure: EXTRACORPOREAL SHOCK WAVE LITHOTRIPSY (ESWL);  Surgeon: Abbie Sons, MD;  Location: [facility] ORS;  Service: Urology;  Laterality: Right;   VASECTOMY      Medical History: Past Medical History:  Diagnosis Date   Hyperlipidemia    Hypertension     Family History: Family History  Problem Relation Age of Onset   Hyperlipidemia Mother    Heart disease Father       Review of Systems  Constitutional:  Negative for chills, fatigue and unexpected weight change.  HENT:  Negative for congestion, postnasal drip, rhinorrhea, sneezing and sore throat.   Eyes:  Negative for redness.  Respiratory:  Negative for cough, chest tightness and shortness of breath.   Cardiovascular:  Negative for chest pain and palpitations.  Gastrointestinal:  Negative for abdominal pain, constipation, diarrhea, nausea and vomiting.  Genitourinary:  Negative for dysuria and frequency.  Musculoskeletal:  Negative for arthralgias, back pain, joint swelling and neck pain.  Skin:  Negative for rash.  Neurological: Negative.  Negative for tremors and numbness.  Hematological:  Negative for adenopathy. Does not bruise/bleed easily.  Psychiatric/Behavioral:  Negative for behavioral problems (Depression), sleep disturbance and suicidal ideas. The patient is not nervous/anxious.     Vital Signs: BP 130/82   Pulse (!) 56   Temp 98.5 F (36.9 C)   Resp 16   Ht 6' (1.829 m)  Wt 167 lb 6.4 oz (75.9 kg)   SpO2 97%   BMI 22.70 kg/m    Physical Exam Vitals and nursing note reviewed.  Constitutional:      General: He is not in acute distress.    Appearance: He is well-developed and normal weight. He is not diaphoretic.  HENT:     Head: Normocephalic and atraumatic.     Right Ear: External ear normal.     Left Ear: External ear normal.     Nose: Nose normal.      Mouth/Throat:     Pharynx: No oropharyngeal exudate.  Eyes:     General: No scleral icterus.       Right eye: No discharge.        Left eye: No discharge.     Conjunctiva/sclera: Conjunctivae normal.     Pupils: Pupils are equal, round, and reactive to light.  Neck:     Thyroid: No thyromegaly.     Vascular: No JVD.     Trachea: No tracheal deviation.  Cardiovascular:     Rate and Rhythm: Normal rate and regular rhythm.     Heart sounds: Normal heart sounds. No murmur heard.   No friction rub. No gallop.  Pulmonary:     Effort: Pulmonary effort is normal. No respiratory distress.     Breath sounds: Normal breath sounds. No stridor. No wheezing or rales.  Chest:     Chest wall: No tenderness.  Abdominal:     General: Bowel sounds are normal. There is no distension.     Palpations: Abdomen is soft. There is no mass.     Tenderness: There is no abdominal tenderness. There is no guarding or rebound.  Musculoskeletal:        General: No tenderness or deformity. Normal range of motion.     Cervical back: Normal range of motion and neck supple.  Lymphadenopathy:     Cervical: No cervical adenopathy.  Skin:    General: Skin is warm and dry.     Coloration: Skin is not pale.     Findings: No erythema or rash.  Neurological:     Mental Status: He is alert.     Cranial Nerves: No cranial nerve deficit.     Motor: No abnormal muscle tone.     Coordination: Coordination normal.     Deep Tendon Reflexes: Reflexes are normal and symmetric.  Psychiatric:        Behavior: Behavior normal.        Thought Content: Thought content normal.        Judgment: Judgment normal.     LABS: Recent Results (from the past 2160 hour(s))  Comprehensive metabolic panel     Status: None   Collection Time: 09/28/20  9:22 AM  Result Value Ref Range   Glucose 92 65 - 99 mg/dL   BUN 21 8 - 27 mg/dL   Creatinine, Ser 1.10 0.76 - 1.27 mg/dL   eGFR 69 >59 mL/min/1.73   BUN/Creatinine Ratio 19 10 - 24    Sodium 140 134 - 144 mmol/L   Potassium 4.2 3.5 - 5.2 mmol/L   Chloride 102 96 - 106 mmol/L   CO2 24 20 - 29 mmol/L   Calcium 9.7 8.6 - 10.2 mg/dL   Total Protein 6.8 6.0 - 8.5 g/dL   Albumin 4.2 3.7 - 4.7 g/dL   Globulin, Total 2.6 1.5 - 4.5 g/dL   Albumin/Globulin Ratio 1.6 1.2 - 2.2   Bilirubin Total 0.7  0.0 - 1.2 mg/dL   Alkaline Phosphatase 85 44 - 121 IU/L   AST 22 0 - 40 IU/L   ALT 22 0 - 44 IU/L  CBC     Status: Abnormal   Collection Time: 09/28/20  9:22 AM  Result Value Ref Range   WBC 5.0 3.4 - 10.8 x10E3/uL   RBC 5.10 4.14 - 5.80 x10E6/uL   Hemoglobin 15.3 13.0 - 17.7 g/dL   Hematocrit 46.0 37.5 - 51.0 %   MCV 90 79 - 97 fL   MCH 30.0 26.6 - 33.0 pg   MCHC 33.3 31.5 - 35.7 g/dL   RDW 13.4 11.6 - 15.4 %   Platelets 142 (L) 150 - 450 x10E3/uL  Lipid Panel With LDL/HDL Ratio     Status: None   Collection Time: 09/28/20  9:22 AM  Result Value Ref Range   Cholesterol, Total 122 100 - 199 mg/dL   Triglycerides 55 0 - 149 mg/dL   HDL 51 >39 mg/dL   VLDL Cholesterol Cal 12 5 - 40 mg/dL   LDL Chol Calc (NIH) 59 0 - 99 mg/dL   LDL/HDL Ratio 1.2 0.0 - 3.6 ratio    Comment:                                     LDL/HDL Ratio                                             Men  Women                               1/2 Avg.Risk  1.0    1.5                                   Avg.Risk  3.6    3.2                                2X Avg.Risk  6.2    5.0                                3X Avg.Risk  8.0    6.1   T4, free     Status: None   Collection Time: 09/28/20  9:22 AM  Result Value Ref Range   Free T4 1.25 0.82 - 1.77 ng/dL  TSH     Status: None   Collection Time: 09/28/20  9:22 AM  Result Value Ref Range   TSH 3.180 0.450 - 4.500 uIU/mL  PSA     Status: None   Collection Time: 09/28/20  9:22 AM  Result Value Ref Range   Prostate Specific Ag, Serum 0.9 0.0 - 4.0 ng/mL    Comment: Roche ECLIA methodology. According to the American Urological Association, Serum PSA  should decrease and remain at undetectable levels after radical prostatectomy. The AUA defines biochemical recurrence as an initial PSA value 0.2 ng/mL or greater followed by a subsequent confirmatory PSA value 0.2 ng/mL or greater. Values obtained with different assay methods or kits cannot  be used interchangeably. Results cannot be interpreted as absolute evidence of the presence or absence of malignant disease.         Assessment/Plan: 1. Encounter for general adult medical examination with abnormal findings CPE performed, labs reviewed  2. Primary hypertension Stable, continue lisinopril  3. Mixed hyperlipidemia Stable, continue lipitor  4. Thrombocytopenia (HCC) Mildly low platelets--continue to monitor  5. OSA on CPAP Continue CPAP nightly  6. Dysuria  7. Need for Tdap vaccination - Tdap ([place]) 5-2.5-18.5 LF-MCG/0.5 injection; Inject 0.5 mLs into the muscle once for 1 dose.  Dispense: 0.5 mL; Refill: 0  8. Need for shingles vaccine - Zoster Vaccine Adjuvanted [facility]) injection; Inject 0.5 mLs into the muscle once for 1 dose.  Dispense: 0.5 mL; Refill: 1    General Counseling: efren kross understanding of the findings of todays visit and agrees with plan of treatment. I have discussed any further diagnostic evaluation that may be needed or ordered today. We also reviewed his medications today. he has been encouraged to call the office with any questions or concerns that should arise related to todays visit.    Counseling:    No orders of the defined types were placed in this encounter.   Meds ordered this encounter  Medications   Tdap (BOOSTRIX) 5-2.5-18.5 LF-MCG/0.5 injection    Sig: Inject 0.5 mLs into the muscle once for 1 dose.    Dispense:  0.5 mL    Refill:  0   Zoster Vaccine Adjuvanted [facility]) injection    Sig: Inject 0.5 mLs into the muscle once for 1 dose.    Dispense:  0.5 mL    Refill:  1    This patient was seen by  Drema Dallas, PA-C in collaboration with Dr. Clayborn Bigness as a part of collaborative care agreement.  Total time spent:35 Minutes  Time spent includes review of chart, medications, test results, and follow up plan with the patient.     Lavera Guise, MD  Internal Medicine

## 2020-10-27 ENCOUNTER — Ambulatory Visit: Payer: Medicare HMO

## 2020-11-03 ENCOUNTER — Ambulatory Visit: Payer: Medicare HMO

## 2020-11-10 ENCOUNTER — Other Ambulatory Visit: Payer: Self-pay

## 2020-11-10 ENCOUNTER — Ambulatory Visit (INDEPENDENT_AMBULATORY_CARE_PROVIDER_SITE_OTHER): Payer: Medicare HMO

## 2020-11-10 DIAGNOSIS — G4733 Obstructive sleep apnea (adult) (pediatric): Secondary | ICD-10-CM | POA: Diagnosis not present

## 2020-11-10 NOTE — Progress Notes (Signed)
95 percentile pressure 12.7   95th percentile leak 1.1   apnea index 0.9 /hr  apnea-hypopnea index  1.1 /hr   total days used  >4 hr 75 days  total days used <4 hr 0 days  Total compliance 100 percent   He is doing great no problems or questions at this time

## 2020-12-06 ENCOUNTER — Other Ambulatory Visit: Payer: Self-pay | Admitting: Urology

## 2020-12-06 DIAGNOSIS — N138 Other obstructive and reflux uropathy: Secondary | ICD-10-CM

## 2020-12-06 DIAGNOSIS — N401 Enlarged prostate with lower urinary tract symptoms: Secondary | ICD-10-CM

## 2020-12-14 ENCOUNTER — Other Ambulatory Visit: Payer: Self-pay | Admitting: Urology

## 2020-12-14 DIAGNOSIS — N138 Other obstructive and reflux uropathy: Secondary | ICD-10-CM

## 2021-02-07 ENCOUNTER — Ambulatory Visit: Payer: Medicare HMO | Admitting: Physician Assistant

## 2021-02-07 ENCOUNTER — Encounter: Payer: Self-pay | Admitting: Physician Assistant

## 2021-02-07 ENCOUNTER — Other Ambulatory Visit: Payer: Self-pay

## 2021-02-07 ENCOUNTER — Ambulatory Visit: Payer: Self-pay | Admitting: Physician Assistant

## 2021-02-07 VITALS — BP 138/84 | HR 48 | Temp 97.9°F | Resp 16 | Ht 72.0 in | Wt 173.6 lb

## 2021-02-07 DIAGNOSIS — G4733 Obstructive sleep apnea (adult) (pediatric): Secondary | ICD-10-CM | POA: Diagnosis not present

## 2021-02-07 DIAGNOSIS — R001 Bradycardia, unspecified: Secondary | ICD-10-CM | POA: Diagnosis not present

## 2021-02-07 DIAGNOSIS — Z7189 Other specified counseling: Secondary | ICD-10-CM

## 2021-02-07 DIAGNOSIS — I10 Essential (primary) hypertension: Secondary | ICD-10-CM

## 2021-02-07 NOTE — Progress Notes (Signed)
[facility] [address] [place], [place] [address]  Pulmonary Sleep Medicine   Office Visit Note  Patient Name: Michael Everett DOB: 08/11/1945 MRN [id number]  Date of Service: 02/07/2021  Complaints/HPI: Pt is here for routine pulmonary follow up for Osa on CPAP. He states he is using CPAP nightly and benefiting from it. He denies any headaches, dryness, or SOB. His download shows 100% compliance and is well controlled with AHI of 1.1/hr. He is changing supplies and cleaning machine regularly.  ROS  General: (-) fever, (-) chills, (-) night sweats, (-) weakness Skin: (-) rashes, (-) itching,. Eyes: (-) visual changes, (-) redness, (-) itching. Nose and Sinuses: (-) nasal stuffiness or itchiness, (-) postnasal drip, (-) nosebleeds, (-) sinus trouble. Mouth and Throat: (-) sore throat, (-) hoarseness. Neck: (-) swollen glands, (-) enlarged thyroid, (-) neck pain. Respiratory: - cough, (-) bloody sputum, - shortness of breath, - wheezing. Cardiovascular: - ankle swelling, (-) chest pain. Lymphatic: (-) lymph node enlargement. Neurologic: (-) numbness, (-) tingling. Psychiatric: (-) anxiety, (-) depression   Current Medication: Outpatient Encounter Medications as of 02/07/2021  Medication Sig   aspirin EC 81 MG tablet Take 81 mg by mouth daily.   atorvastatin (LIPITOR) 10 MG tablet TAKE 1 TABLET (10 MG TOTAL) BY MOUTH DAILY AT 6 PM.   finasteride (PROSCAR) 5 MG tablet TAKE 1 TABLET BY MOUTH EVERY DAY   fluticasone (FLONASE) 50 MCG/ACT nasal spray Place 1 spray into both nostrils daily.   lisinopril (ZESTRIL) 40 MG tablet TAKE 1 TABLET BY MOUTH EVERY DAY FOR BLOOD PRESSURE CONTROL   tamsulosin (FLOMAX) 0.4 MG CAPS capsule TAKE 1 CAPSULE BY MOUTH EVERY DAY   No facility-administered encounter medications on file as of 02/07/2021.    Surgical History: Past Surgical History:  Procedure Laterality Date   cataract and lens implant right eye  2015   CATARACT  EXTRACTION, BILATERAL Bilateral 10/2013   EXTRACORPOREAL SHOCK WAVE LITHOTRIPSY Right 10/23/2019   Procedure: EXTRACORPOREAL SHOCK WAVE LITHOTRIPSY (ESWL);  Surgeon: Riki Altes, MD;  Location: [facility] ORS;  Service: Urology;  Laterality: Right;   VASECTOMY      Medical History: Past Medical History:  Diagnosis Date   Hyperlipidemia    Hypertension     Family History: Family History  Problem Relation Age of Onset   Hyperlipidemia Mother    Heart disease Father     Social History: Social History   Socioeconomic History   Marital status: Married    Spouse name: Not on file   Number of children: Not on file   Years of education: Not on file   Highest education level: Not on file  Occupational History   Not on file  Tobacco Use   Smoking status: Former    Types: Cigarettes   Smokeless tobacco: Never  Vaping Use   Vaping Use: Never used  Substance and Sexual Activity   Alcohol use: No   Drug use: No   Sexual activity: Not on file  Other Topics Concern   Not on file  Social History Narrative   Not on file   Social Determinants of Health   Financial Resource Strain: Not on file  Food Insecurity: Not on file  Transportation Needs: Not on file  Physical Activity: Not on file  Stress: Not on file  Social Connections: Not on file  Intimate Partner Violence: Not on file    Vital Signs: Blood pressure 138/84, pulse (!) 48, temperature 97.9 F (36.6 C), resp. rate  16, height 6' (1.829 m), weight 173 lb 9.6 oz (78.7 kg), SpO2 100 %.  Examination: General Appearance: The patient is well-developed, well-nourished, and in no distress. Skin: Gross inspection of skin unremarkable. Head: normocephalic, no gross deformities. Eyes: no gross deformities noted. ENT: ears appear grossly normal no exudates. Neck: Supple. No thyromegaly. No LAD. Respiratory: Lungs clear to auscultation bilaterally. Cardiovascular: Normal S1 and S2 without murmur or rub. Extremities: No  cyanosis. pulses are equal. Neurologic: Alert and oriented. No involuntary movements.  LABS: No results found for this or any previous visit (from the past 2160 hour(s)).  Radiology: US RENAL  Result Date: 12/22/2019 CLINICAL DATA:  Nephrolithiasis, RIGHT ureteral calculus, hydronephrosis, post lithotripsy 10/23/2019 EXAM: RENAL / URINARY TRACT ULTRASOUND COMPLETE COMPARISON:  CT abdomen and pelvis 10/21/2019 FINDINGS: Right Kidney: Renal measurements: 11.3 x 4.6 x 4.9 cm = volume: 134 mL. Normal cortical thickness. Upper normal cortical echogenicity. No mass, hydronephrosis, or definite shadowing calcification. Tiny echogenic focus 3 mm diameter at inferior pole, nonshadowing, nonspecific. Left Kidney: Renal measurements: 12.1 x 5.5 x 5.0 cm = volume: 173 mL. Normal cortical thickness with upper normal echogenicity. No mass, hydronephrosis, or shadowing calcification. Bladder: Well distended, unremarkable. Prostate gland appears prominent at 3.9 x 3.3 x 5.7 cm in size (volume = 38 cm^3) Other: N/A IMPRESSION: No evidence of renal mass or hydronephrosis. Question 3 mm nonshadowing calculus versus artifact at inferior pole RIGHT kidney. Mild prostatic enlargement. Electronically Signed   By: Ulyses Southward M.D.   On: 12/22/2019 16:12    No results found.  No results found.    Assessment and Plan: Patient Active Problem List   Diagnosis Date Noted   Encounter for general adult medical examination with abnormal findings 09/29/2019   Bradycardia 03/29/2019   OSA on CPAP 03/29/2019   Diastolic dysfunction 11/17/2018   Snoring 11/17/2018   Special screening for malignant neoplasm of prostate 09/17/2017   Need for vaccination against Streptococcus pneumoniae using pneumococcal conjugate vaccine 13 09/17/2017   Dysuria 09/17/2017   Hypertension 04/29/2017   Hyperlipidemia 04/29/2017   Cervicalgia 04/29/2017    1. Obstructive sleep apnea Continue excellent compliance  2. CPAP use  counseling CPAP couseling-Discussed importance of adequate CPAP use as well as proper care and cleaning techniques of machine and all supplies.  3. Primary hypertension Well controlled, f/u with PCP  4. Bradycardia Asymptomatic, f/u with PCP   General Counseling: I have discussed the findings of the evaluation and examination with Alden Server.  I have also discussed any further diagnostic evaluation thatmay be needed or ordered today. [place] verbalizes understanding of the findings of todays visit. We also reviewed his medications today and discussed drug interactions and side effects including but not limited excessive drowsiness and altered mental states. We also discussed that there is always a risk not just to him but also people around him. he has been encouraged to call the office with any questions or concerns that should arise related to todays visit.  No orders of the defined types were placed in this encounter.    Time spent: 30  I have personally obtained a history, examined the patient, evaluated laboratory and imaging results, formulated the assessment and plan and placed orders. This patient was seen by Lynn Ito, PA-C in collaboration with Dr. Freda Munro as a part of collaborative care agreement.     Yevonne Pax, MD [facility] Sleep medicine

## 2021-02-20 ENCOUNTER — Other Ambulatory Visit: Payer: Self-pay | Admitting: Internal Medicine

## 2021-02-22 ENCOUNTER — Telehealth: Payer: Self-pay

## 2021-02-22 ENCOUNTER — Other Ambulatory Visit: Payer: Self-pay

## 2021-02-22 MED ORDER — AZITHROMYCIN 250 MG PO TABS: ORAL_TABLET | ORAL | 0 refills | Status: DC

## 2021-02-22 NOTE — Telephone Encounter (Signed)
Pt called that was still tested positive for Covid  he try OTC is not helping as per dr Welton Flakes send Azithromycin for 10 days

## 2021-02-22 NOTE — Telephone Encounter (Signed)
Pt called  that was tested positive for covid on 02/13/21 he did test again still showing positive advised its showed positive for some times until 3 months he said just cough no other symptoms advised him to take OTC mucinex and claritin if not feeling better call us back we can send antibiotic

## 2021-03-16 ENCOUNTER — Other Ambulatory Visit: Payer: Self-pay

## 2021-03-16 DIAGNOSIS — N138 Other obstructive and reflux uropathy: Secondary | ICD-10-CM

## 2021-04-04 ENCOUNTER — Other Ambulatory Visit: Payer: Medicare HMO

## 2021-04-04 ENCOUNTER — Other Ambulatory Visit: Payer: Self-pay

## 2021-04-04 DIAGNOSIS — N138 Other obstructive and reflux uropathy: Secondary | ICD-10-CM

## 2021-04-05 LAB — PSA: Prostate Specific Ag, Serum: 0.8 ng/mL (ref 0.0–4.0)

## 2021-04-07 ENCOUNTER — Ambulatory Visit (INDEPENDENT_AMBULATORY_CARE_PROVIDER_SITE_OTHER): Payer: Medicare HMO | Admitting: Physician Assistant

## 2021-04-07 ENCOUNTER — Encounter: Payer: Self-pay | Admitting: Physician Assistant

## 2021-04-07 ENCOUNTER — Other Ambulatory Visit: Payer: Self-pay

## 2021-04-07 DIAGNOSIS — I10 Essential (primary) hypertension: Secondary | ICD-10-CM | POA: Diagnosis not present

## 2021-04-07 DIAGNOSIS — R5383 Other fatigue: Secondary | ICD-10-CM

## 2021-04-07 DIAGNOSIS — D696 Thrombocytopenia, unspecified: Secondary | ICD-10-CM | POA: Diagnosis not present

## 2021-04-07 DIAGNOSIS — E782 Mixed hyperlipidemia: Secondary | ICD-10-CM

## 2021-04-07 DIAGNOSIS — R7989 Other specified abnormal findings of blood chemistry: Secondary | ICD-10-CM

## 2021-04-07 DIAGNOSIS — J0100 Acute maxillary sinusitis, unspecified: Secondary | ICD-10-CM

## 2021-04-07 DIAGNOSIS — H938X3 Other specified disorders of ear, bilateral: Secondary | ICD-10-CM

## 2021-04-07 DIAGNOSIS — Z0189 Encounter for other specified special examinations: Secondary | ICD-10-CM

## 2021-04-07 MED ORDER — AMOXICILLIN-POT CLAVULANATE 875-125 MG PO TABS: 1.0000 | ORAL_TABLET | Freq: Two times a day (BID) | ORAL | 0 refills | Status: DC

## 2021-04-07 NOTE — Progress Notes (Signed)
[facility] [address] [place], [place] [address]  Internal MEDICINE  Office Visit Note  Patient Name: Michael Everett  [id number]  [id number]  Date of Service: 04/13/2021  Chief Complaint  Patient presents with   Follow-up   Hyperlipidemia   Hypertension   Ear Problem    Feels like ears are full and having some nasal congestion - started about 2.5 weeks ago   Quality Metric Gaps    Pneumonia and Shingles vaccines    HPI Pt is here for routine follow up -Both ears are full and hearing muffled with some sinus congestion for the last 2.5 weeks. Tried mucinex with decongestant, uses flonase. -Denies any cough, wheezing, or SOB -Bp not checked at home and takes med at night, will start checking and log readings., Will call if elevated as meds may need to be adjusted -Concerns about receiving shingles vaccine and concern over side effects and will think about it further -Will recheck routine fasting labs prior to CPE  Current Medication: Outpatient Encounter Medications as of 04/07/2021  Medication Sig   amoxicillin-clavulanate (AUGMENTIN) 875-125 MG tablet Take 1 tablet by mouth 2 (two) times daily. Take with food.   aspirin EC 81 MG tablet Take 81 mg by mouth daily.   atorvastatin (LIPITOR) 10 MG tablet TAKE 1 TABLET (10 MG TOTAL) BY MOUTH DAILY AT 6 PM.   finasteride (PROSCAR) 5 MG tablet TAKE 1 TABLET BY MOUTH EVERY DAY   fluticasone (FLONASE) 50 MCG/ACT nasal spray Place 1 spray into both nostrils daily.   lisinopril (ZESTRIL) 40 MG tablet TAKE 1 TABLET BY MOUTH EVERY DAY FOR BLOOD PRESSURE CONTROL   tamsulosin (FLOMAX) 0.4 MG CAPS capsule TAKE 1 CAPSULE BY MOUTH EVERY DAY   [DISCONTINUED] azithromycin (ZITHROMAX) 250 MG tablet Take 1 tab po daily for 10 days   No facility-administered encounter medications on file as of 04/07/2021.    Surgical History: Past Surgical History:  Procedure Laterality Date   cataract and lens implant right eye  2015    CATARACT EXTRACTION, BILATERAL Bilateral 10/2013   EXTRACORPOREAL SHOCK WAVE LITHOTRIPSY Right 10/23/2019   Procedure: EXTRACORPOREAL SHOCK WAVE LITHOTRIPSY (ESWL);  Surgeon: Riki Altes, MD;  Location: [facility] ORS;  Service: Urology;  Laterality: Right;   VASECTOMY      Medical History: Past Medical History:  Diagnosis Date   Hyperlipidemia    Hypertension     Family History: Family History  Problem Relation Age of Onset   Hyperlipidemia Mother    Heart disease Father     Social History   Socioeconomic History   Marital status: Married    Spouse name: Not on file   Number of children: Not on file   Years of education: Not on file   Highest education level: Not on file  Occupational History   Not on file  Tobacco Use   Smoking status: Former    Types: Cigarettes   Smokeless tobacco: Never  Vaping Use   Vaping Use: Never used  Substance and Sexual Activity   Alcohol use: No   Drug use: No   Sexual activity: Not on file  Other Topics Concern   Not on file  Social History Narrative   Not on file   Social Determinants of Health   Financial Resource Strain: Not on file  Food Insecurity: Not on file  Transportation Needs: Not on file  Physical Activity: Not on file  Stress: Not on file  Social Connections: Not on file  Intimate Partner Violence: Not on file      Review of Systems  Constitutional:  Negative for chills, fatigue and unexpected weight change.  HENT:  Positive for congestion, ear pain and postnasal drip. Negative for rhinorrhea, sneezing and sore throat.   Eyes:  Negative for redness.  Respiratory:  Negative for cough, chest tightness and shortness of breath.   Cardiovascular:  Negative for chest pain and palpitations.  Gastrointestinal:  Negative for abdominal pain, constipation, diarrhea, nausea and vomiting.  Genitourinary:  Negative for dysuria and frequency.  Musculoskeletal:  Negative for arthralgias, back pain, joint swelling and neck  pain.  Skin:  Negative for rash.  Neurological: Negative.  Negative for tremors and numbness.  Hematological:  Negative for adenopathy. Does not bruise/bleed easily.  Psychiatric/Behavioral:  Negative for behavioral problems (Depression), sleep disturbance and suicidal ideas. The patient is not nervous/anxious.    Vital Signs: BP (!) 152/82 Comment: 169/91   Pulse (!) 58    Temp 98 F (36.7 C)    Resp 16    Ht 6' (1.829 m)    Wt 174 lb 9.6 oz (79.2 kg)    SpO2 98%    BMI 23.68 kg/m    Physical Exam Vitals and nursing note reviewed.  Constitutional:      General: He is not in acute distress.    Appearance: He is well-developed and normal weight. He is not diaphoretic.  HENT:     Head: Normocephalic and atraumatic.     Right Ear: Ear canal normal.     Left Ear: Ear canal normal.     Mouth/Throat:     Pharynx: No oropharyngeal exudate.  Eyes:     Pupils: Pupils are equal, round, and reactive to light.  Neck:     Thyroid: No thyromegaly.     Vascular: No JVD.     Trachea: No tracheal deviation.  Cardiovascular:     Rate and Rhythm: Normal rate and regular rhythm.     Heart sounds: Normal heart sounds. No murmur heard.   No friction rub. No gallop.  Pulmonary:     Effort: Pulmonary effort is normal. No respiratory distress.     Breath sounds: No wheezing or rales.  Chest:     Chest wall: No tenderness.  Abdominal:     General: Bowel sounds are normal.     Palpations: Abdomen is soft.  Musculoskeletal:        General: Normal range of motion.     Cervical back: Normal range of motion and neck supple.  Lymphadenopathy:     Cervical: No cervical adenopathy.  Skin:    General: Skin is warm and dry.  Neurological:     Mental Status: He is alert and oriented to person, place, and time.     Cranial Nerves: No cranial nerve deficit.  Psychiatric:        Behavior: Behavior normal.        Thought Content: Thought content normal.        Judgment: Judgment normal.        Assessment/Plan: 1. Acute non-recurrent maxillary sinusitis Will start on augmentin with food and continue mucinex and flonase - amoxicillin-clavulanate (AUGMENTIN) 875-125 MG tablet; Take 1 tablet by mouth 2 (two) times daily. Take with food.  Dispense: 20 tablet; Refill: 0  2. Ear pressure, bilateral - amoxicillin-clavulanate (AUGMENTIN) 875-125 MG tablet; Take 1 tablet by mouth 2 (two) times daily. Take with food.  Dispense: 20 tablet; Refill: 0  3. Primary hypertension  Elevated in office, will log BP at home and notify office if still elevated  4. Thrombocytopenia (HCC) - CBC w/Diff/Platelet  5. Mixed hyperlipidemia - Lipid Panel With LDL/HDL Ratio  6. Abnormal thyroid blood test - TSH + free T4  7. Encounter for laboratory test - CBC w/Diff/Platelet - Comprehensive metabolic panel - TSH + free T4 - Lipid Panel With LDL/HDL Ratio  8. Other fatigue - CBC w/Diff/Platelet - Comprehensive metabolic panel - TSH + free T4 - Lipid Panel With LDL/HDL Ratio   General Counseling: alvaro aungst understanding of the findings of todays visit and agrees with plan of treatment. I have discussed any further diagnostic evaluation that may be needed or ordered today. We also reviewed his medications today. he has been encouraged to call the office with any questions or concerns that should arise related to todays visit.    Orders Placed This Encounter  Procedures   CBC w/Diff/Platelet   Comprehensive metabolic panel   TSH + free T4   Lipid Panel With LDL/HDL Ratio    Meds ordered this encounter  Medications   amoxicillin-clavulanate (AUGMENTIN) 875-125 MG tablet    Sig: Take 1 tablet by mouth 2 (two) times daily. Take with food.    Dispense:  20 tablet    Refill:  0    This patient was seen by Lynn Ito, PA-C in collaboration with Dr. Beverely Risen as a part of collaborative care agreement.   Total time spent:30 Minutes Time spent includes review of  chart, medications, test results, and follow up plan with the patient.      Dr Lyndon Code Internal medicine

## 2021-04-12 NOTE — Progress Notes (Signed)
04/13/2021 9:02 PM   Foye Spurling 1945-06-23 [id number]  Referring provider: Ronnell Freshwater, NP [place],  [place] [address]  Chief Complaint  Patient presents with   Benign Prostatic Hypertrophy   Nephrolithiasis   Urological history 1.  Nephrolithiasis - ESWL on 10/23/2019 for 7 mm right proximal ureteral calculus  - RUS 12/2019 no hydro - stone composition 70% calcium oxalate monohydrate and 30% calcium oxalate dihydrate -KUB no stones seen  2. BPH with LU TS -PSA 0.8 (1.6) in 03/2021 -I PSS 8/2 -managed with finasteride 5 mg daily and tamsulosin 0.4 mg daily  HPI: Michael Everett is a 76 y.o. who presents today for a one year follow up.    He is waking up with LBP, mostly on the left, that has been occurring for several months.  It is relieved with voiding.  He states the amount of voided urine is normal.   The back pain is then gone for the rest of the day.    Patient denies any modifying or aggravating factors.  Patient denies any gross hematuria, dysuria or suprapubic/flank pain.  Patient denies any fevers, chills, nausea or vomiting.    He was not able to give a urine specimen at this visit.  PVR  43 mL  KUB no stones seen.     IPSS     Row Name 04/13/21 0800         International Prostate Symptom Score   How often have you had the sensation of not emptying your bladder? Less than half the time     How often have you had to urinate less than every two hours? About half the time     How often have you found you stopped and started again several times when you urinated? Less than 1 in 5 times     How often have you found it difficult to postpone urination? Less than 1 in 5 times     How often have you had a weak urinary stream? Less than 1 in 5 times     How often have you had to strain to start urination? Not at All     How many times did you typically get up at night to urinate? None     Total IPSS Score 8       Quality of  Life due to urinary symptoms   If you were to spend the rest of your life with your urinary condition just the way it is now how would you feel about that? Mostly Satisfied               Score:  1-7 Mild 8-19 Moderate 20-35 Severe  PMH: Past Medical History:  Diagnosis Date   Hyperlipidemia    Hypertension     Surgical History: Past Surgical History:  Procedure Laterality Date   cataract and lens implant right eye  2015   CATARACT EXTRACTION, BILATERAL Bilateral 10/2013   EXTRACORPOREAL SHOCK WAVE LITHOTRIPSY Right 10/23/2019   Procedure: EXTRACORPOREAL SHOCK WAVE LITHOTRIPSY (ESWL);  Surgeon: Abbie Sons, MD;  Location: [facility] ORS;  Service: Urology;  Laterality: Right;   VASECTOMY      Home Medications:  Current Outpatient Medications on File Prior to Visit  Medication Sig Dispense Refill   amoxicillin-clavulanate (AUGMENTIN) 875-125 MG tablet Take 1 tablet by mouth 2 (two) times daily. Take with food. 20 tablet 0   aspirin EC 81 MG tablet Take 81 mg by mouth  daily.     atorvastatin (LIPITOR) 10 MG tablet TAKE 1 TABLET (10 MG TOTAL) BY MOUTH DAILY AT 6 PM. 90 tablet 2   finasteride (PROSCAR) 5 MG tablet TAKE 1 TABLET BY MOUTH EVERY DAY 90 tablet 3   fluticasone (FLONASE) 50 MCG/ACT nasal spray Place 1 spray into both nostrils daily. 48 mL 3   lisinopril (ZESTRIL) 40 MG tablet TAKE 1 TABLET BY MOUTH EVERY DAY FOR BLOOD PRESSURE CONTROL 90 tablet 2   tamsulosin (FLOMAX) 0.4 MG CAPS capsule TAKE 1 CAPSULE BY MOUTH EVERY DAY 90 capsule 1   No current facility-administered medications on file prior to visit.    Allergies: No Known Allergies  Family History: Family History  Problem Relation Age of Onset   Hyperlipidemia Mother    Heart disease Father     Social History:  reports that he has quit smoking. His smoking use included cigarettes. He has never used smokeless tobacco. He reports that he does not drink alcohol and does not use drugs.  ROS: Pertinent ROS  in HPI  Physical Exam: BP (!) 164/82    Pulse (!) 54    Ht 6' (1.829 m)    Wt 175 lb (79.4 kg)    BMI 23.73 kg/m   Constitutional:  Well nourished. Alert and oriented, No acute distress. HEENT: [place] AT, mask in place.  Trachea midline Cardiovascular: No clubbing, cyanosis, or edema. Respiratory: Normal respiratory effort, no increased work of breathing. Neurologic: Grossly intact, no focal deficits, moving all 4 extremities. Psychiatric: Normal mood and affect.   Laboratory Data: Component     Latest Ref Rng & Units 09/17/2017 09/18/2018 09/29/2019 04/09/2020  Prostate Specific Ag, Serum     0.0 - 4.0 ng/mL 2.8 2.6 3.3 1.1   Component     Latest Ref Rng & Units 09/28/2020 04/04/2021  Prostate Specific Ag, Serum     0.0 - 4.0 ng/mL 0.9 0.8   Component     Latest Ref Rng & Units 09/28/2020  TSH     0.450 - 4.500 uIU/mL 3.180   Component     Latest Ref Rng & Units 09/28/2020  Cholesterol, Total     100 - 199 mg/dL 122  Triglycerides     0 - 149 mg/dL 55  HDL Cholesterol     >39 mg/dL 51  VLDL Cholesterol Cal     5 - 40 mg/dL 12  LDL Chol Calc (NIH)     0 - 99 mg/dL 59  LDL/HDL Ratio     0.0 - 3.6 ratio 1.2   CBC Latest Ref Rng & Units 09/28/2020 10/21/2019 09/29/2019  WBC 3.4 - 10.8 x10E3/uL 5.0 11.0(H) 5.4  Hemoglobin 13.0 - 17.7 g/dL 15.3 15.9 15.6  Hematocrit 37.5 - 51.0 % 46.0 44.9 44.6  Platelets 150 - 450 x10E3/uL 142(L) 152 159    CMP Latest Ref Rng & Units 09/28/2020 10/21/2019 09/29/2019  Glucose 65 - 99 mg/dL 92 124(H) 95  BUN 8 - 27 mg/dL 21 24(H) 22  Creatinine 0.76 - 1.27 mg/dL 1.10 1.42(H) 1.01  Sodium 134 - 144 mmol/L 140 139 140  Potassium 3.5 - 5.2 mmol/L 4.2 4.3 4.3  Chloride 96 - 106 mmol/L 102 101 103  CO2 20 - 29 mmol/L 24 27 25   Calcium 8.6 - 10.2 mg/dL 9.7 9.4 9.8  Total Protein 6.0 - 8.5 g/dL 6.8 7.6 7.3  Total Bilirubin 0.0 - 1.2 mg/dL 0.7 1.5(H) 1.1  Alkaline Phos 44 - 121 IU/L 85 68 86  AST 0 - 40 IU/L 22 25 20   ALT 0 - 44 IU/L 22 24 22   I have  reviewed the labs.   Pertinent Imaging: CLINICAL DATA:  Kidney stones.  Annual follow-up   EXAM: ABDOMEN - 1 VIEW   COMPARISON:  12/08/2019   FINDINGS: There is a non obstructive bowel gas pattern. No supine evidence of free air. No organomegaly or suspicious calcification. No acute bony abnormality.   IMPRESSION: No visible urinary tract calculi.   No acute findings.     Electronically Signed   By: Rolm Baptise M.D.   On: 04/13/2021 23:20 I have independently reviewed the films.  See HPI.      Assessment & Plan:    1. BPH with LUTS -PSA stable -PVR < 300 cc --continue conservative management, avoiding bladder irritants and timed voiding's -Continue tamsulosin 0.4 mg daily and finasteride 5 mg daily   2. Nephrolithiasis -KUB no stone seen  3. Lower back pain -History is more consistent with arthritis as it is present in the morning and then improved relieved with movement as the day goes on -We did discuss pursuing further imaging to rule out an obstructing stone, but he would like to defer this unless his symptoms worsen  Return in about 1 year (around 04/13/2022) for KUB, UA, I PSS and PSA .  These notes generated with voice recognition software. I apologize for typographical errors.  Zara Council, PA-C  [facility] [address]  [place], [place] [address] [phone]

## 2021-04-13 ENCOUNTER — Encounter: Payer: Self-pay | Admitting: Urology

## 2021-04-13 ENCOUNTER — Ambulatory Visit
Admission: RE | Admit: 2021-04-13 | Discharge: 2021-04-13 | Disposition: A | Discharge status: Home / Self Care | Payer: Medicare HMO | Source: Ambulatory Visit | Attending: Urology | Admitting: Urology

## 2021-04-13 ENCOUNTER — Ambulatory Visit
Admission: RE | Admit: 2021-04-13 | Discharge: 2021-04-13 | Disposition: A | Discharge status: Home / Self Care | Payer: Medicare HMO | Attending: Urology | Admitting: Urology

## 2021-04-13 ENCOUNTER — Other Ambulatory Visit: Payer: Self-pay

## 2021-04-13 ENCOUNTER — Ambulatory Visit: Payer: Medicare HMO | Admitting: Urology

## 2021-04-13 VITALS — BP 164/82 | HR 54 | Ht 72.0 in | Wt 175.0 lb

## 2021-04-13 DIAGNOSIS — M545 Low back pain, unspecified: Secondary | ICD-10-CM

## 2021-04-13 DIAGNOSIS — N200 Calculus of kidney: Secondary | ICD-10-CM | POA: Insufficient documentation

## 2021-04-13 DIAGNOSIS — N401 Enlarged prostate with lower urinary tract symptoms: Secondary | ICD-10-CM

## 2021-04-13 DIAGNOSIS — N138 Other obstructive and reflux uropathy: Secondary | ICD-10-CM | POA: Diagnosis not present

## 2021-04-13 DIAGNOSIS — G8929 Other chronic pain: Secondary | ICD-10-CM

## 2021-04-13 LAB — BLADDER SCAN AMB NON-IMAGING: Scan Result: 43

## 2021-04-19 ENCOUNTER — Ambulatory Visit: Payer: Self-pay | Admitting: Urology

## 2021-05-18 ENCOUNTER — Other Ambulatory Visit: Payer: Self-pay

## 2021-05-18 ENCOUNTER — Ambulatory Visit (INDEPENDENT_AMBULATORY_CARE_PROVIDER_SITE_OTHER): Payer: Medicare HMO

## 2021-05-18 DIAGNOSIS — G4733 Obstructive sleep apnea (adult) (pediatric): Secondary | ICD-10-CM

## 2021-05-18 NOTE — Progress Notes (Signed)
95 percentile pressure 13.4 ? ? 95th percentile leak 1.0 ? ? apnea index 0.9 /hr ? apnea-hypopnea index  1.4 /hr ? ? total days used  >4 hr 90 days ? total days used <4 hr 0 days ? ?Total compliance 100 percent ? ?He is doing great no problems or questions at this time. ? ?Pt was seen by Claiborne Billings  RRT/RCP  from [facility]  ?

## 2021-06-03 IMAGING — CR DG ABDOMEN 1V
1 series · 1 of 1 positions shown · non-contrast
Comparison: CT abdomen pelvis dated 10/21/2019.

CLINICAL DATA: 74-year-old male with kidney stone.

EXAM:
ABDOMEN - 1 VIEW

[dg abd 1 view]
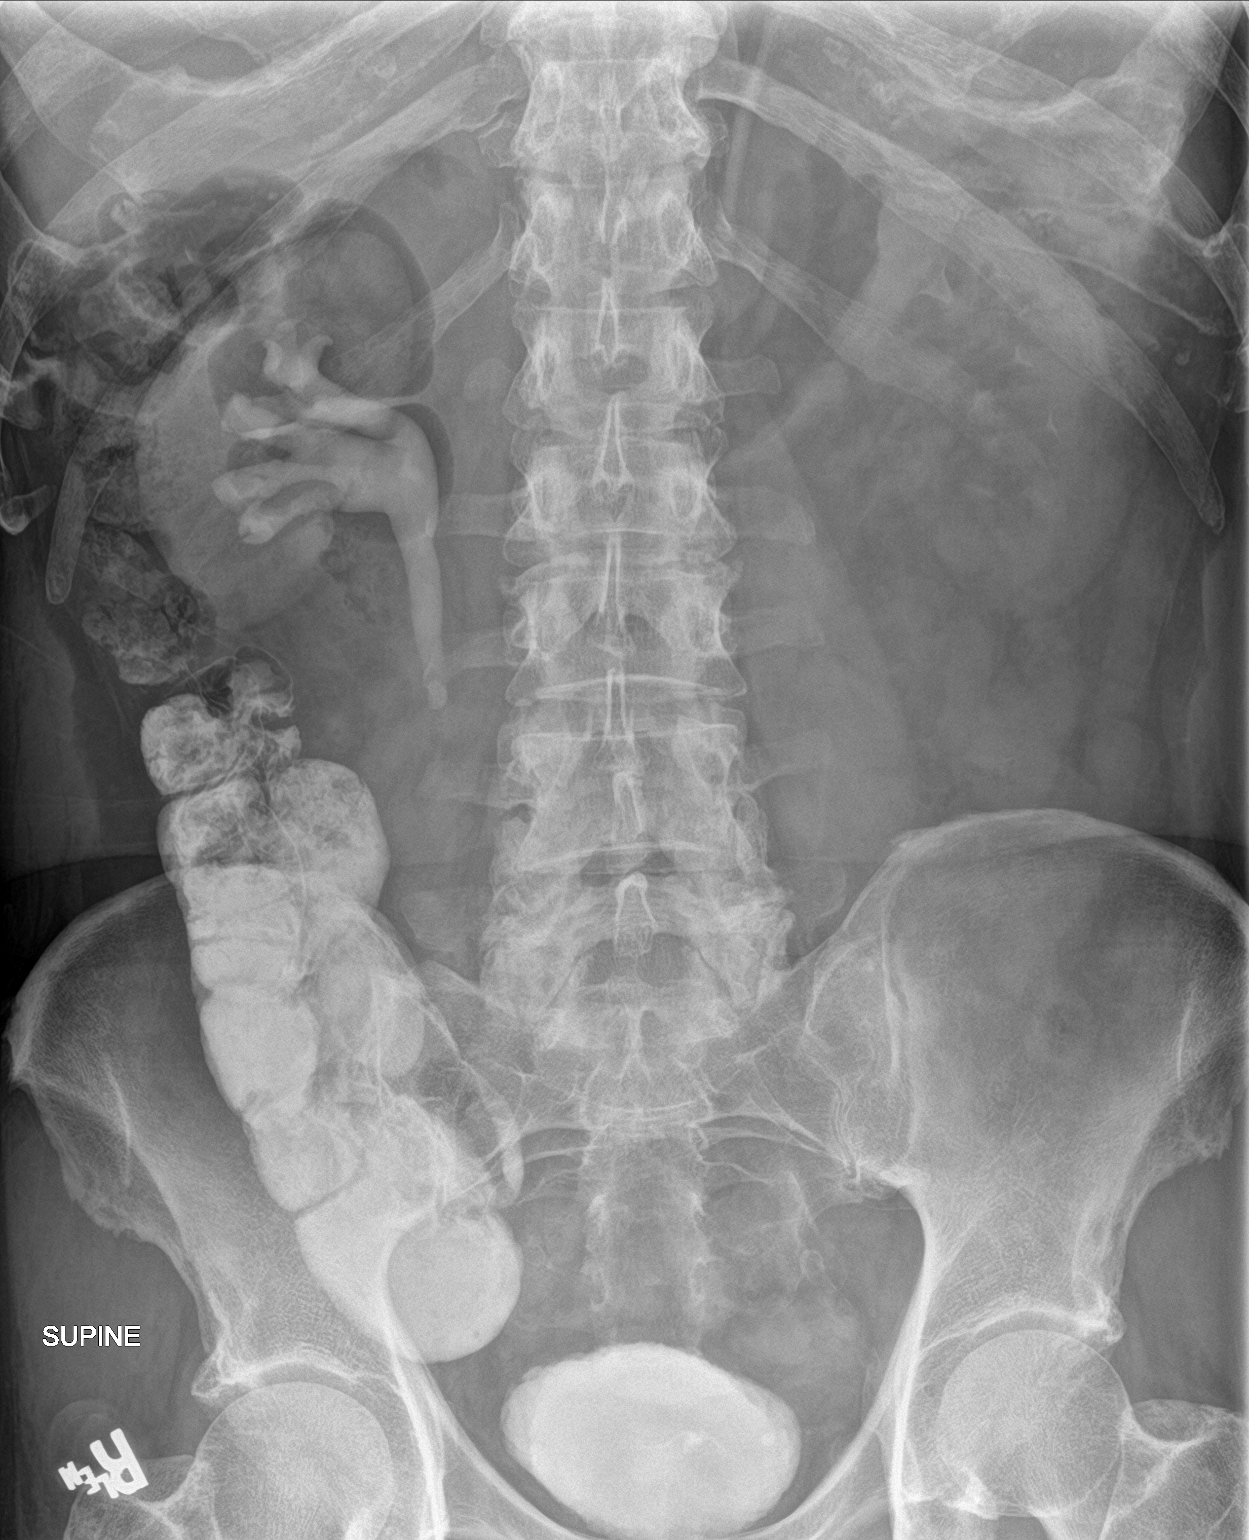

[1 of 1 positions shown; findings below may reference images not displayed]

FINDINGS: There is a 7 mm obstructing stone in the proximal right ureter with
mild right hydronephrosis. Excreted contrast noted in the right
renal collecting system and proximal right ureter proximal to the
stone. No contrast noted in the right ureter distal to the stone
consistent with obstruction. Excreted contrast noted within the
urinary bladder. There is trabeculated appearance of the bladder
wall likely related to chronic bladder outlet obstruction.

There is no bowel dilatation or evidence of obstruction. Oral
contrast noted in the proximal colon. The osseous structures and
soft tissues are unremarkable.
IMPRESSION: A 7 mm obstructing stone in the proximal right ureter with mild
right hydronephrosis.

## 2021-06-11 ENCOUNTER — Other Ambulatory Visit: Payer: Self-pay | Admitting: Urology

## 2021-06-11 DIAGNOSIS — N401 Enlarged prostate with lower urinary tract symptoms: Secondary | ICD-10-CM

## 2021-07-20 IMAGING — CR DG ABDOMEN 1V
1 series · 2 of 2 positions shown · non-contrast
Comparison: 11/07/2019

CLINICAL DATA: Kidney stone follow-up

EXAM:
ABDOMEN - 1 VIEW

[Series 1: dg abd 1 view · 0.14mm/px · 2 of 2 slices shown]
[im 1/2]
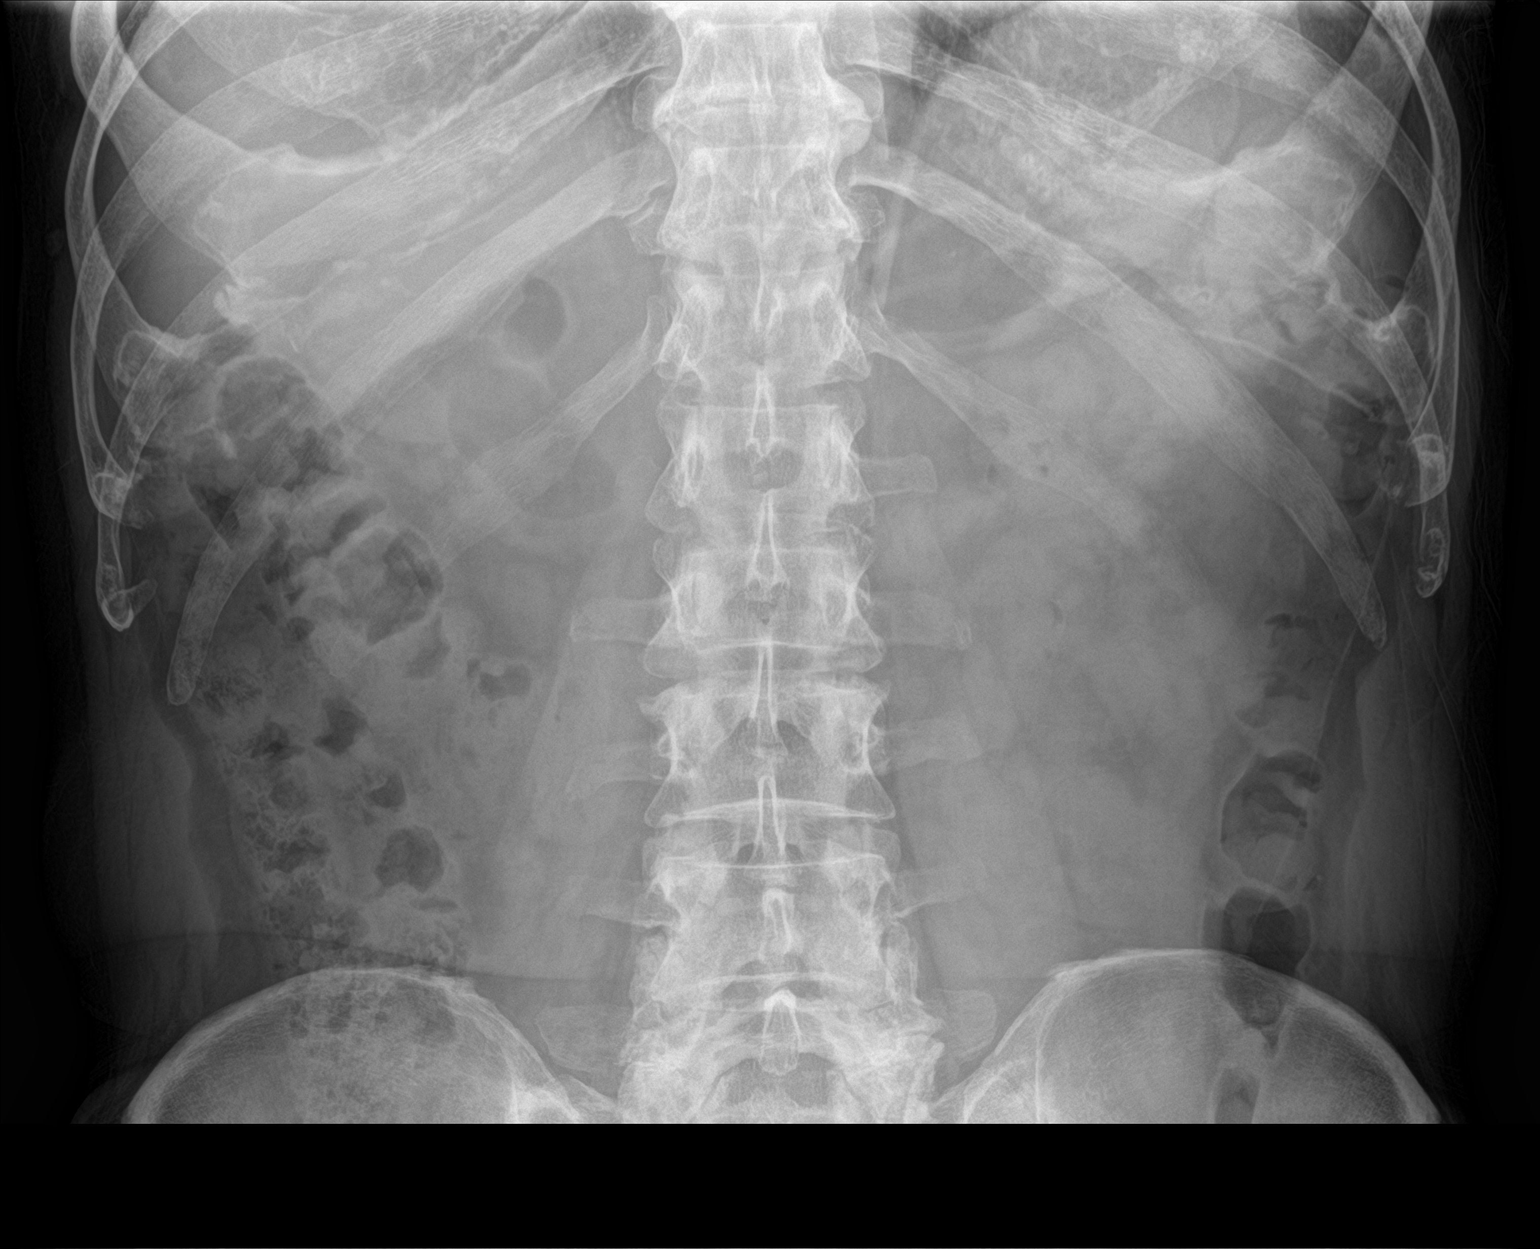
[im 2/2]
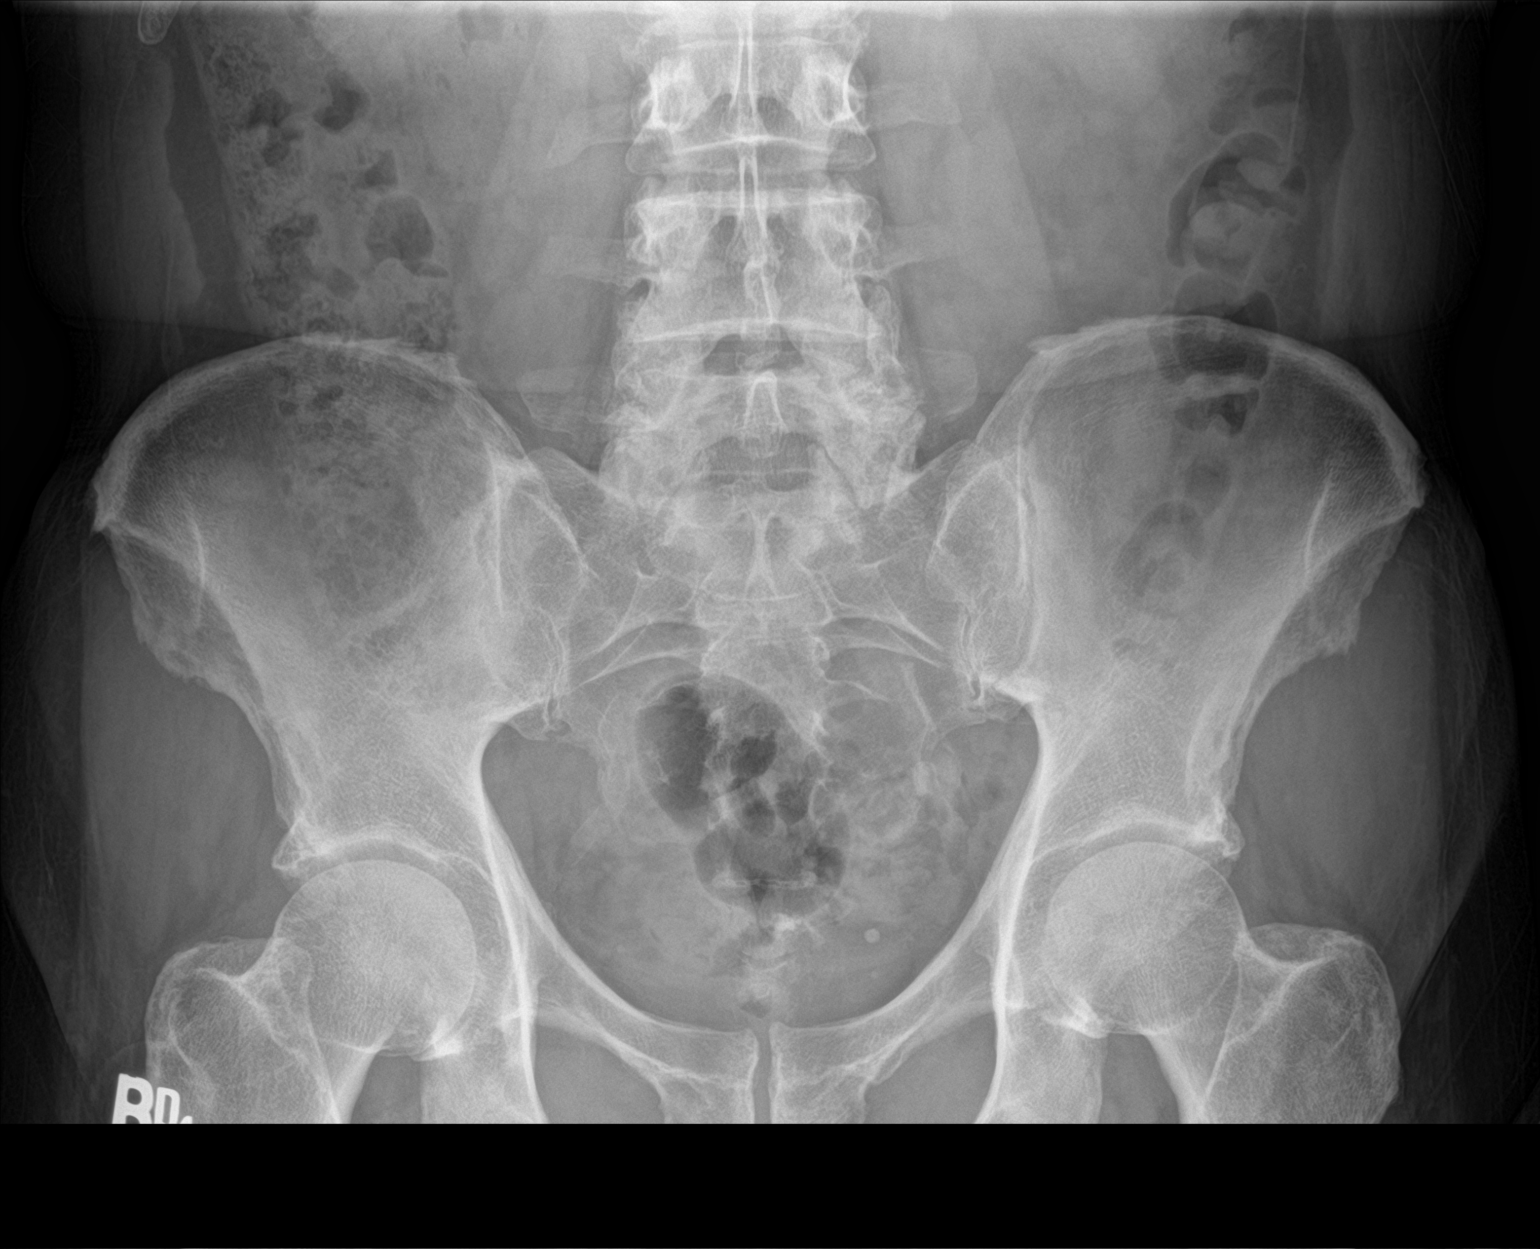

[2 of 2 positions shown; findings below may reference images not displayed]

FINDINGS: Previously seen right ureteral calculus is no longer visualized. No
new radiopaque density. Similar phleboliths in the anatomic pelvis.
Nonobstructive bowel gas pattern. Lower lumbar degenerative change.
IMPRESSION: Previously seen right ureteral calculus is no longer visualized. No
new calculi identified.

## 2021-08-03 IMAGING — US US RENAL
1 series · 14 of 25 positions shown · non-contrast
Comparison: CT abdomen and pelvis 10/21/2019

CLINICAL DATA: Nephrolithiasis, RIGHT ureteral calculus,
hydronephrosis, post lithotripsy 10/23/2019

EXAM:
RENAL / URINARY TRACT ULTRASOUND COMPLETE

[Series 1: us renal · 0.23mm/px · 14 of 42 slices shown]
[im 1/42]
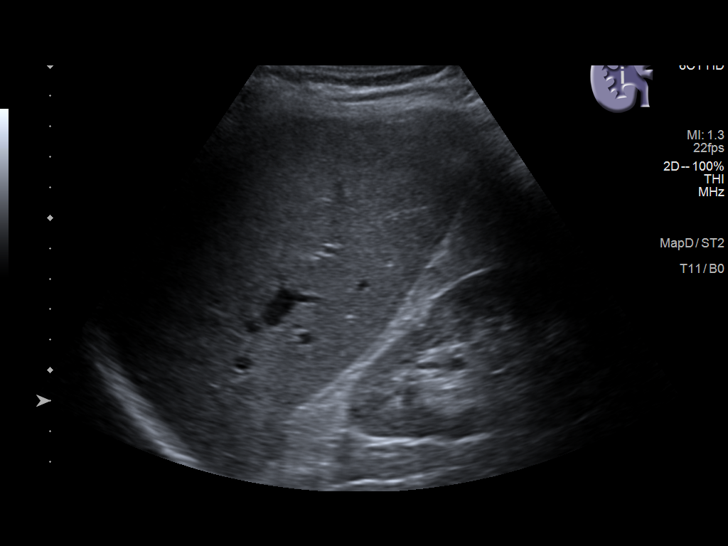
[im 4/42]
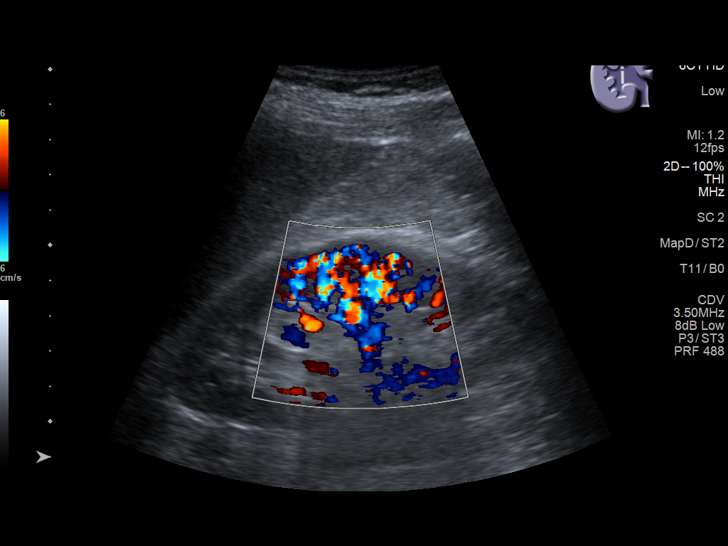
[im 7/42]
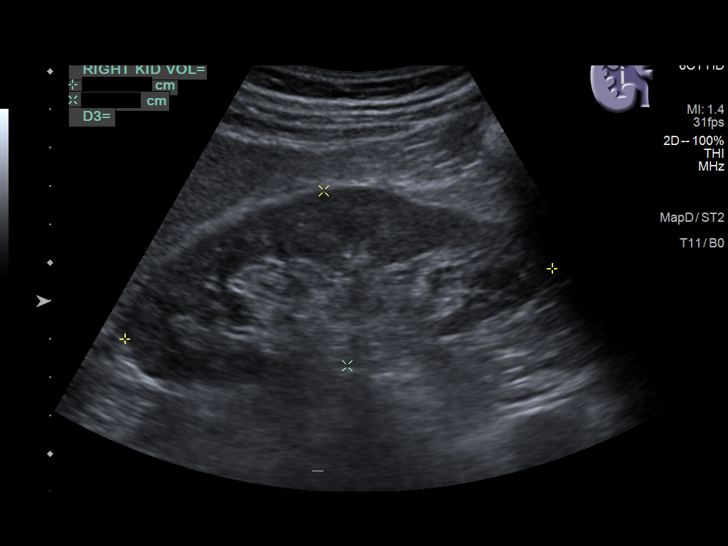
[im 11/42]
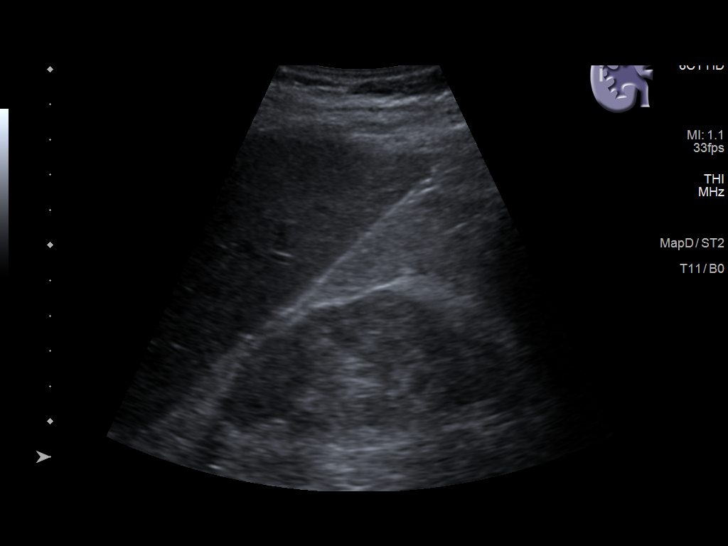
[im 14/42]
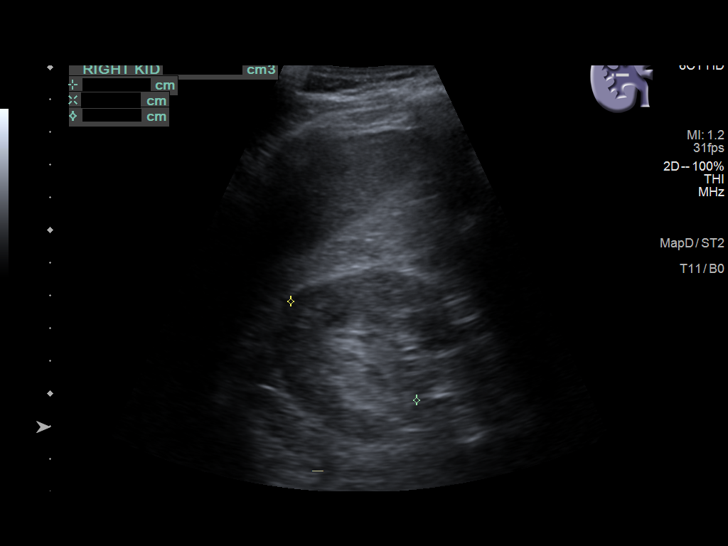
[im 16/42]
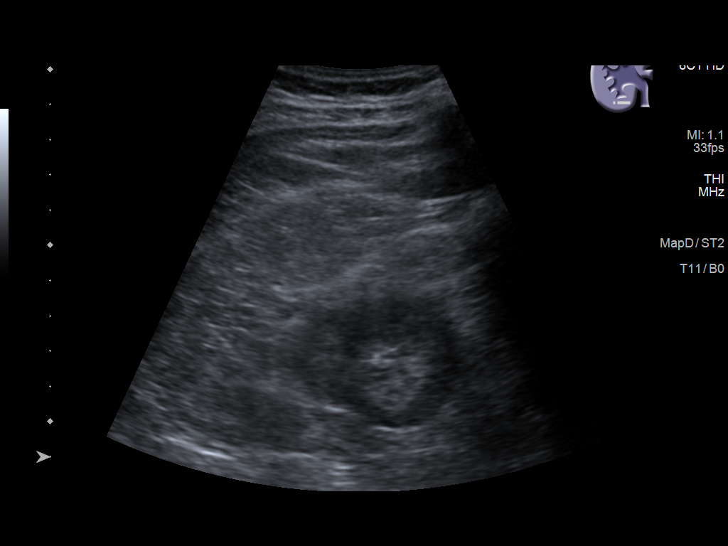
[im 19/42]
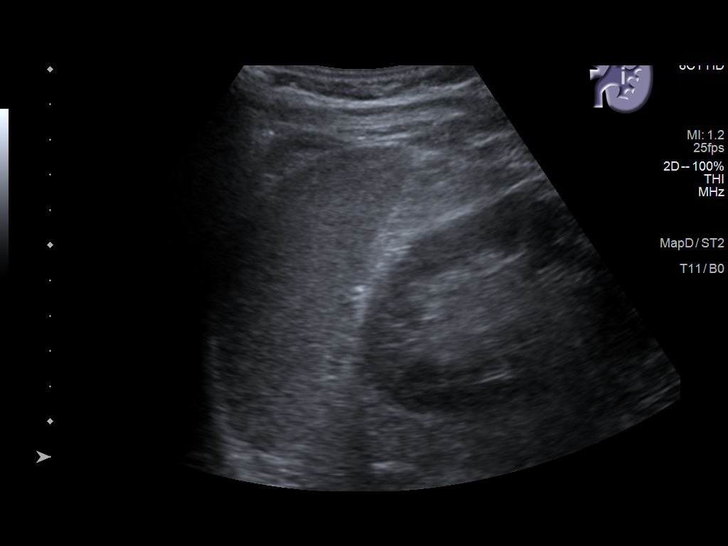
[im 23/42]
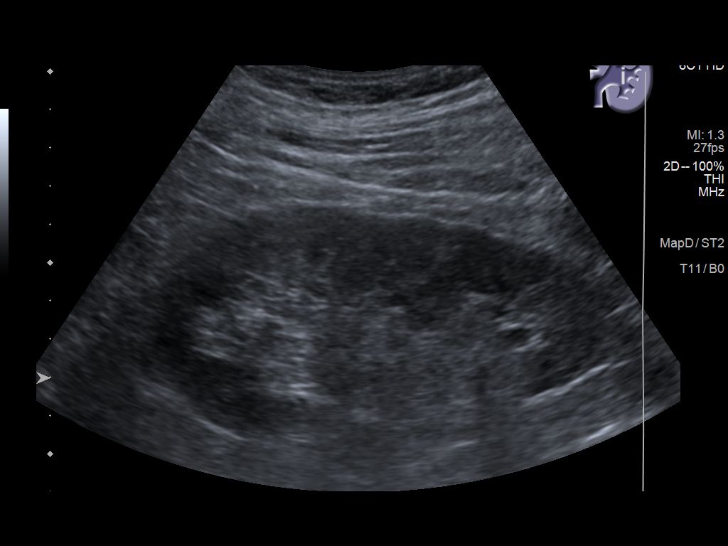
[im 26/42]
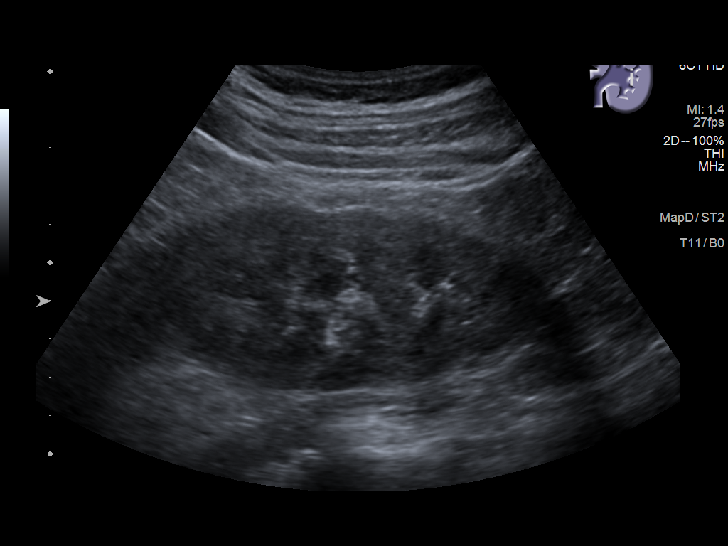
[im 28/42]
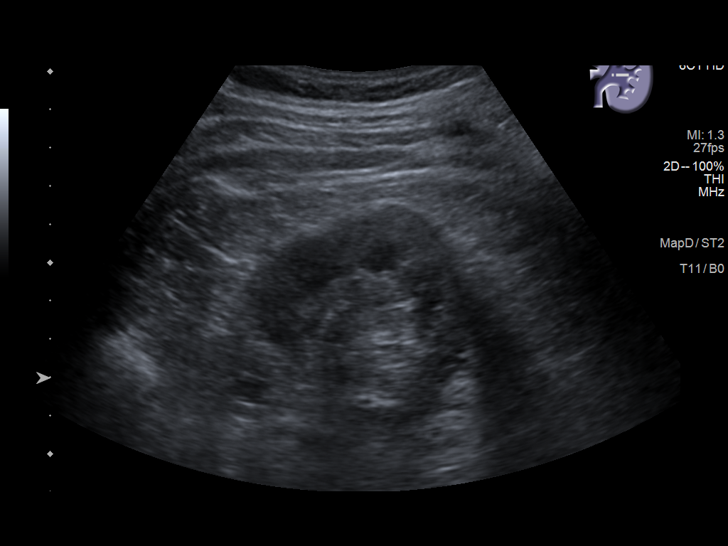
[im 31/42]
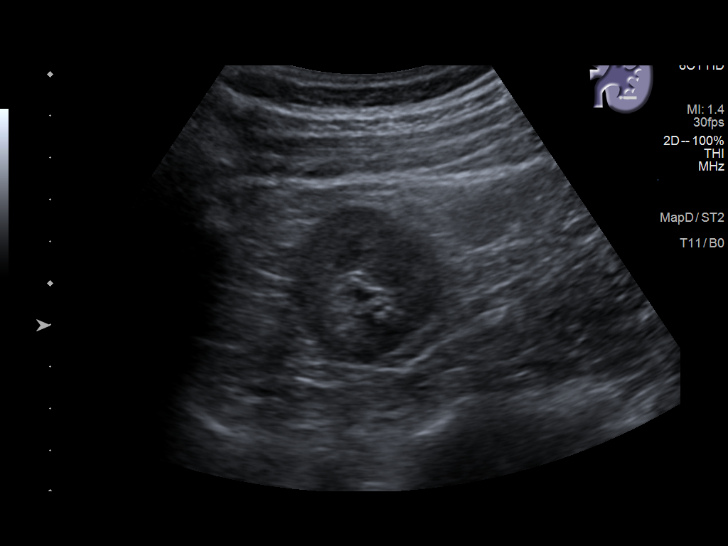
[im 35/42]
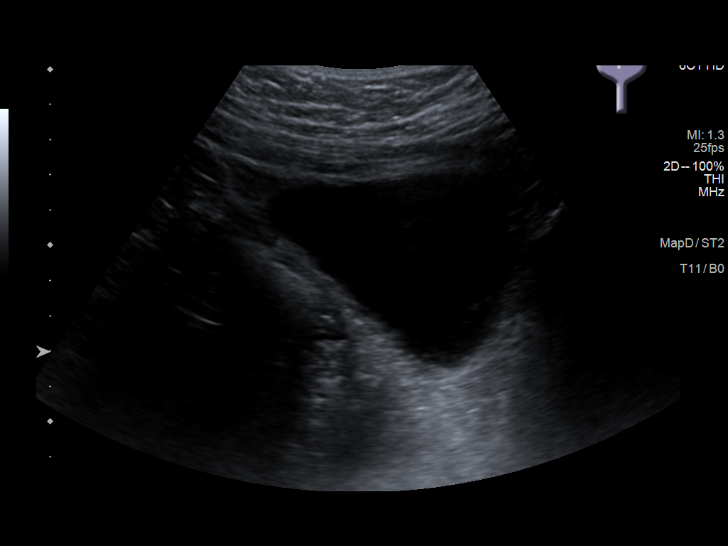
[im 38/42]
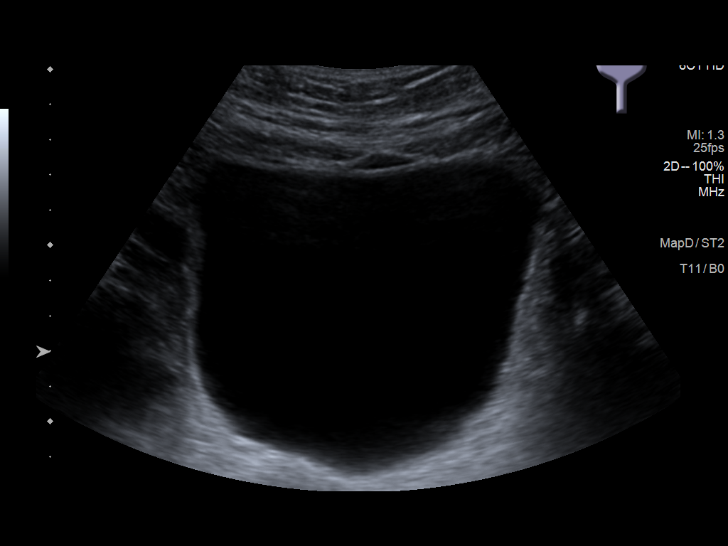
[im 42/42]
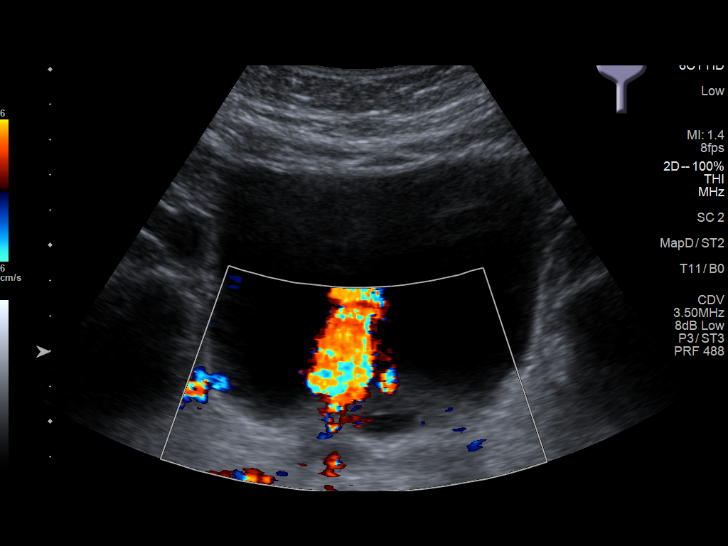

[14 of 25 positions shown; findings below may reference images not displayed]

FINDINGS: Right Kidney:

Renal measurements: 11.3 x 4.6 x 4.9 cm = volume: 134 mL. Normal
cortical thickness. Upper normal cortical echogenicity. No mass,
hydronephrosis, or definite shadowing calcification. Tiny echogenic
focus 3 mm diameter at inferior pole, nonshadowing, nonspecific.

Left Kidney:

Renal measurements: 12.1 x 5.5 x 5.0 cm = volume: 173 mL. Normal
cortical thickness with upper normal echogenicity. No mass,
hydronephrosis, or shadowing calcification.

Bladder:

Well distended, unremarkable. Prostate gland appears prominent at
3.9 x 3.3 x 5.7 cm in size (volume = 38 cm^3)

Other:

N/A
IMPRESSION: No evidence of renal mass or hydronephrosis.

Question 3 mm nonshadowing calculus versus artifact at inferior pole
RIGHT kidney.

Mild prostatic enlargement.

## 2021-09-23 ENCOUNTER — Other Ambulatory Visit: Payer: Self-pay | Admitting: Internal Medicine

## 2021-10-07 LAB — LIPID PANEL WITH LDL/HDL RATIO
Cholesterol, Total: 119 mg/dL (ref 100–199)
HDL: 43 mg/dL (ref >39)
LDL Chol Calc (NIH): 62 mg/dL (ref 0–99)
LDL/HDL Ratio: 1.4 ratio (ref 0.0–3.6)
Triglycerides: 67 mg/dL (ref 0–149)
VLDL Cholesterol Cal: 14 mg/dL (ref 5–40)

## 2021-10-07 LAB — CBC WITH DIFFERENTIAL/PLATELET
Basophils Absolute: 0 10*3/uL (ref 0.0–0.2)
Basos: 1 %
EOS (ABSOLUTE): 0.2 10*3/uL (ref 0.0–0.4)
Eos: 3 %
Hematocrit: 48.5 % (ref 37.5–51.0)
Hemoglobin: 15.9 g/dL (ref 13.0–17.7)
Immature Grans (Abs): 0 10*3/uL (ref 0.0–0.1)
Immature Granulocytes: 0 %
Lymphocytes Absolute: 1 10*3/uL (ref 0.7–3.1)
Lymphs: 21 %
MCH: 29.3 pg (ref 26.6–33.0)
MCHC: 32.8 g/dL (ref 31.5–35.7)
MCV: 89 fL (ref 79–97)
Monocytes Absolute: 0.4 10*3/uL (ref 0.1–0.9)
Monocytes: 9 %
Neutrophils Absolute: 3.2 10*3/uL (ref 1.4–7.0)
Neutrophils: 66 %
Platelets: 157 10*3/uL (ref 150–450)
RBC: 5.43 x10E6/uL (ref 4.14–5.80)
RDW: 13.4 % (ref 11.6–15.4)
WBC: 4.8 10*3/uL (ref 3.4–10.8)

## 2021-10-07 LAB — COMPREHENSIVE METABOLIC PANEL
ALT: 23 IU/L (ref 0–44)
AST: 20 IU/L (ref 0–40)
Albumin/Globulin Ratio: 1.8 (ref 1.2–2.2)
Albumin: 4.4 g/dL (ref 3.8–4.8)
Alkaline Phosphatase: 76 IU/L (ref 44–121)
BUN/Creatinine Ratio: 20 (ref 10–24)
BUN: 21 mg/dL (ref 8–27)
Bilirubin Total: 1 mg/dL (ref 0.0–1.2)
CO2: 24 mmol/L (ref 20–29)
Calcium: 9.7 mg/dL (ref 8.6–10.2)
Chloride: 102 mmol/L (ref 96–106)
Creatinine, Ser: 1.07 mg/dL (ref 0.76–1.27)
Globulin, Total: 2.5 g/dL (ref 1.5–4.5)
Glucose: 87 mg/dL (ref 70–99)
Potassium: 4.6 mmol/L (ref 3.5–5.2)
Sodium: 140 mmol/L (ref 134–144)
Total Protein: 6.9 g/dL (ref 6.0–8.5)
eGFR: 71 mL/min/{1.73_m2} (ref >59)

## 2021-10-07 LAB — TSH+FREE T4
Free T4: 1.17 ng/dL (ref 0.82–1.77)
TSH: 2.14 u[IU]/mL (ref 0.450–4.500)

## 2021-10-14 ENCOUNTER — Ambulatory Visit
Admission: RE | Admit: 2021-10-14 | Discharge: 2021-10-14 | Disposition: A | Discharge status: Home / Self Care | Payer: Medicare HMO | Source: Ambulatory Visit | Attending: Physician Assistant | Admitting: Physician Assistant

## 2021-10-14 ENCOUNTER — Encounter: Payer: Self-pay | Admitting: Physician Assistant

## 2021-10-14 ENCOUNTER — Ambulatory Visit (INDEPENDENT_AMBULATORY_CARE_PROVIDER_SITE_OTHER): Payer: Medicare HMO | Admitting: Physician Assistant

## 2021-10-14 VITALS — BP 172/80 | HR 53 | Temp 98.3°F | Resp 16 | Ht 72.0 in | Wt 180.0 lb

## 2021-10-14 DIAGNOSIS — M542 Cervicalgia: Secondary | ICD-10-CM

## 2021-10-14 DIAGNOSIS — Z0001 Encounter for general adult medical examination with abnormal findings: Secondary | ICD-10-CM

## 2021-10-14 DIAGNOSIS — I10 Essential (primary) hypertension: Secondary | ICD-10-CM | POA: Diagnosis not present

## 2021-10-14 DIAGNOSIS — R300 Dysuria: Secondary | ICD-10-CM | POA: Diagnosis not present

## 2021-10-14 DIAGNOSIS — G4733 Obstructive sleep apnea (adult) (pediatric): Secondary | ICD-10-CM

## 2021-10-14 MED ORDER — CYCLOBENZAPRINE HCL 5 MG PO TABS: 5.0000 mg | ORAL_TABLET | Freq: Every day | ORAL | 1 refills | Status: DC
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
## 2021-10-15 LAB — UA/M W/RFLX CULTURE, ROUTINE
Bilirubin, UA: NEGATIVE
Glucose, UA: NEGATIVE
Ketones, UA: NEGATIVE
Leukocytes,UA: NEGATIVE
Nitrite, UA: NEGATIVE
Protein,UA: NEGATIVE
RBC, UA: NEGATIVE
Specific Gravity, UA: 1.022 (ref 1.005–1.030)
Urobilinogen, Ur: 0.2 mg/dL (ref 0.2–1.0)
pH, UA: 5 (ref 5.0–7.5)

## 2021-10-15 LAB — MICROSCOPIC EXAMINATION
Bacteria, UA: NONE SEEN
Casts: NONE SEEN /lpf
Epithelial Cells (non renal): NONE SEEN /hpf (ref 0–10)

## 2021-10-18 ENCOUNTER — Telehealth: Payer: Self-pay

## 2021-10-18 NOTE — Telephone Encounter (Signed)
-----   Message from Carlean Jews, PA-C sent at 10/17/2021  1:59 PM EDT ----- Please let him know that his xray showed degenerative changes, but did not show any acute findings.

## 2021-10-18 NOTE — Telephone Encounter (Signed)
Left message for patient that x-ray did not show any acute findings.

## 2021-11-11 ENCOUNTER — Other Ambulatory Visit: Payer: Self-pay | Admitting: Physician Assistant

## 2021-11-22 ENCOUNTER — Other Ambulatory Visit: Payer: Self-pay

## 2021-11-22 MED ORDER — TETANUS-DIPHTH-ACELL PERTUSSIS 5-2.5-18.5 LF-MCG/0.5 IM SUSP: 0.5000 mL | Freq: Once | INTRAMUSCULAR | 0 refills | Status: AC

## 2021-11-23 ENCOUNTER — Ambulatory Visit (INDEPENDENT_AMBULATORY_CARE_PROVIDER_SITE_OTHER): Payer: Medicare HMO

## 2021-11-23 DIAGNOSIS — G4733 Obstructive sleep apnea (adult) (pediatric): Secondary | ICD-10-CM | POA: Diagnosis not present

## 2021-11-23 NOTE — Progress Notes (Signed)
95 percentile pressure 12.8   95th percentile leak 5.2   apnea index 0.8 /hr  apnea-hypopnea index  1.1 /hr   total days used  >4 hr 90 days  total days used <4 hr 0 days  Total compliance 100 percent  He is doing great no problems or questions at this time Pt was seen by Tresa Endo  RRT/RCP  from [facility]

## 2021-12-01 ENCOUNTER — Ambulatory Visit (INDEPENDENT_AMBULATORY_CARE_PROVIDER_SITE_OTHER): Payer: Medicare HMO | Admitting: Physician Assistant

## 2021-12-01 ENCOUNTER — Encounter: Payer: Self-pay | Admitting: Physician Assistant

## 2021-12-01 VITALS — BP 158/84 | HR 66 | Temp 97.8°F | Resp 16 | Ht 72.0 in | Wt 180.0 lb

## 2021-12-01 DIAGNOSIS — I10 Essential (primary) hypertension: Secondary | ICD-10-CM

## 2021-12-01 DIAGNOSIS — M542 Cervicalgia: Secondary | ICD-10-CM

## 2021-12-01 NOTE — Progress Notes (Signed)
[facility] [address] [place], [place] [address]  Internal MEDICINE  Office Visit Note  Patient Name: Michael Everett  [id number]  [id number]  Date of Service: 12/01/2021  Chief Complaint  Patient presents with   Follow-up   Hyperlipidemia   Hypertension   Quality Metric Gaps    Pneumonia and Tetanus Vaccines    HPI Pt is here for routine follow up for HTN -his BP has been 135/80s at home on his monitor. He did bring his cuff to the office today and it read 154/98 which is consistent with elevated in office readings and is likely reading accurately at home. Likely white coat syndrome causing spike I noffice but controlled at home and he will continue to monitor and contact office if rising -Neck ROM is still a little limited still, but pain is decreased, has been taking the flexeril at night. Unsure if he still needs it and will try going without it and take only if needed. Thinks he is doing better with time. Will let office know if any desire to see PT or ortho -labs reviewed over the phone already and looked good  Current Medication: Outpatient Encounter Medications as of 12/01/2021  Medication Sig   amoxicillin-clavulanate (AUGMENTIN) 875-125 MG tablet Take 1 tablet by mouth 2 (two) times daily. Take with food.   aspirin EC 81 MG tablet Take 81 mg by mouth daily.   atorvastatin (LIPITOR) 10 MG tablet TAKE 1 TABLET (10 MG TOTAL) BY MOUTH DAILY AT 6 PM.   cyclobenzaprine (FLEXERIL) 5 MG tablet Take 1 tablet (5 mg total) by mouth at bedtime.   finasteride (PROSCAR) 5 MG tablet TAKE 1 TABLET BY MOUTH EVERY DAY   fluticasone (FLONASE) 50 MCG/ACT nasal spray SPRAY 1 SPRAY INTO BOTH NOSTRILS DAILY.   lisinopril (ZESTRIL) 40 MG tablet TAKE 1 TABLET BY MOUTH EVERY DAY FOR BLOOD PRESSURE CONTROL   tamsulosin (FLOMAX) 0.4 MG CAPS capsule TAKE 1 CAPSULE BY MOUTH EVERY DAY   No facility-administered encounter medications on file as of 12/01/2021.    Surgical  History: Past Surgical History:  Procedure Laterality Date   cataract and lens implant right eye  2015   CATARACT EXTRACTION, BILATERAL Bilateral 10/2013   EXTRACORPOREAL SHOCK WAVE LITHOTRIPSY Right 10/23/2019   Procedure: EXTRACORPOREAL SHOCK WAVE LITHOTRIPSY (ESWL);  Surgeon: Riki Altes, MD;  Location: [facility] ORS;  Service: Urology;  Laterality: Right;   VASECTOMY      Medical History: Past Medical History:  Diagnosis Date   Hyperlipidemia    Hypertension     Family History: Family History  Problem Relation Age of Onset   Hyperlipidemia Mother    Heart disease Father     Social History   Socioeconomic History   Marital status: Married    Spouse name: Not on file   Number of children: Not on file   Years of education: Not on file   Highest education level: Not on file  Occupational History   Not on file  Tobacco Use   Smoking status: Former    Types: Cigarettes   Smokeless tobacco: Never  Vaping Use   Vaping Use: Never used  Substance and Sexual Activity   Alcohol use: No   Drug use: No   Sexual activity: Not on file  Other Topics Concern   Not on file  Social History Narrative   Not on file   Social Determinants of Health   Financial Resource Strain: Not on file  Food Insecurity: Not  on file  Transportation Needs: Not on file  Physical Activity: Not on file  Stress: Not on file  Social Connections: Not on file  Intimate Partner Violence: Not on file      Review of Systems  Constitutional:  Negative for chills, fatigue and unexpected weight change.  HENT:  Negative for congestion, postnasal drip, rhinorrhea, sneezing and sore throat.   Eyes:  Negative for redness.  Respiratory:  Negative for cough, chest tightness and shortness of breath.   Cardiovascular:  Negative for chest pain and palpitations.  Gastrointestinal:  Negative for abdominal pain, constipation, diarrhea, nausea and vomiting.  Genitourinary:  Negative for dysuria and frequency.   Musculoskeletal:  Negative for arthralgias, back pain, joint swelling and neck pain.  Skin:  Negative for rash.  Neurological: Negative.  Negative for tremors and numbness.  Hematological:  Negative for adenopathy. Does not bruise/bleed easily.  Psychiatric/Behavioral:  Negative for behavioral problems (Depression), sleep disturbance and suicidal ideas. The patient is not nervous/anxious.     Vital Signs: BP (!) 158/84   Pulse 66   Temp 97.8 F (36.6 C)   Resp 16   Ht 6' (1.829 m)   Wt 180 lb (81.6 kg)   SpO2 98%   BMI 24.41 kg/m    Physical Exam Vitals and nursing note reviewed.  Constitutional:      General: He is not in acute distress.    Appearance: He is well-developed and normal weight. He is not diaphoretic.  HENT:     Head: Normocephalic and atraumatic.     Right Ear: Ear canal normal.     Left Ear: Ear canal normal.     Mouth/Throat:     Pharynx: No oropharyngeal exudate.  Eyes:     Pupils: Pupils are equal, round, and reactive to light.  Neck:     Thyroid: No thyromegaly.     Vascular: No JVD.     Trachea: No tracheal deviation.  Cardiovascular:     Rate and Rhythm: Normal rate and regular rhythm.     Heart sounds: Normal heart sounds. No murmur heard.    No friction rub. No gallop.  Pulmonary:     Effort: Pulmonary effort is normal. No respiratory distress.     Breath sounds: No wheezing or rales.  Chest:     Chest wall: No tenderness.  Abdominal:     General: Bowel sounds are normal.     Palpations: Abdomen is soft.  Musculoskeletal:        General: Normal range of motion.     Cervical back: Neck supple.  Skin:    General: Skin is warm and dry.  Neurological:     Mental Status: He is alert and oriented to person, place, and time.     Cranial Nerves: No cranial nerve deficit.  Psychiatric:        Behavior: Behavior normal.        Thought Content: Thought content normal.        Judgment: Judgment normal.        Assessment/Plan: 1. White  coat syndrome with diagnosis of hypertension Home cuff reading consistent with in office reading therefore stable home readings reassuring and likely elevated in office due to white coat syndrome. Will continue lisinopril as before and will contact office if rising  2. Neck pain Improving, will try stopping flexeril and only use of needed.    General Counseling: arshan jabs understanding of the findings of todays visit and agrees with plan of  treatment. I have discussed any further diagnostic evaluation that may be needed or ordered today. We also reviewed his medications today. he has been encouraged to call the office with any questions or concerns that should arise related to todays visit.    No orders of the defined types were placed in this encounter.   No orders of the defined types were placed in this encounter.   This patient was seen by Lynn Ito, PA-C in collaboration with Dr. Beverely Risen as a part of collaborative care agreement.   Total time spent:30 Minutes Time spent includes review of chart, medications, test results, and follow up plan with the patient.      Dr Lyndon Code Internal medicine

## 2021-12-02 ENCOUNTER — Ambulatory Visit: Payer: Medicare HMO | Admitting: Physician Assistant

## 2021-12-10 ENCOUNTER — Other Ambulatory Visit: Payer: Self-pay | Admitting: Urology

## 2021-12-10 DIAGNOSIS — N138 Other obstructive and reflux uropathy: Secondary | ICD-10-CM

## 2022-02-13 ENCOUNTER — Ambulatory Visit: Payer: Medicare HMO | Admitting: Physician Assistant

## 2022-02-17 ENCOUNTER — Ambulatory Visit (INDEPENDENT_AMBULATORY_CARE_PROVIDER_SITE_OTHER): Payer: Medicare HMO | Admitting: Nurse Practitioner

## 2022-02-17 ENCOUNTER — Encounter: Payer: Self-pay | Admitting: Nurse Practitioner

## 2022-02-17 VITALS — BP 140/80 | HR 60 | Temp 97.4°F | Resp 16 | Ht 72.0 in | Wt 182.0 lb

## 2022-02-17 DIAGNOSIS — G4733 Obstructive sleep apnea (adult) (pediatric): Secondary | ICD-10-CM | POA: Diagnosis not present

## 2022-02-17 DIAGNOSIS — Z7189 Other specified counseling: Secondary | ICD-10-CM | POA: Diagnosis not present

## 2022-02-17 NOTE — Progress Notes (Signed)
[facility] [address] [place], [place] [address]  Internal MEDICINE  Office Visit Note  Patient Name: Michael Everett  [id number]  [id number]  Date of Service: 02/17/2022  Chief Complaint  Patient presents with   Follow-up    Pulmonary visit.      HPI Michael Everett presents for a follow up visit for OSA and CPAP use.   CPAP download: 95 percentile pressure 12.8 95th percentile leak 5.2 apnea index 0.8 /hr apnea-hypopnea index  1.1 /hr total days used  >4 hr 90 days total days used <4 hr 0 days  Total compliance 100 percent   He is doing great no problems or questions at this time Pt was seen by Tresa Endo  RRT/RCP  from [facility]   EPWORTH SLEEPINESS SCALE: Scale: (0)= no chance of dozing; (1)= slight chance of dozing; (2)= moderate chance of dozing; (3)= high chance of dozing Chance  Situtation Sitting and reading: 0 Watching TV: 1   Sitting Inactive in public: 0 As a passenger in car: 0   Lying down to rest: 1 Sitting and talking: 0 Sitting quielty after lunch: 0 In a car, stopped in traffic: 0 TOTAL SCORE:   2 out of 24   Current Medication: Outpatient Encounter Medications as of 02/17/2022  Medication Sig   amoxicillin-clavulanate (AUGMENTIN) 875-125 MG tablet Take 1 tablet by mouth 2 (two) times daily. Take with food.   aspirin EC 81 MG tablet Take 81 mg by mouth daily.   atorvastatin (LIPITOR) 10 MG tablet TAKE 1 TABLET (10 MG TOTAL) BY MOUTH DAILY AT 6 PM.   cyclobenzaprine (FLEXERIL) 5 MG tablet Take 1 tablet (5 mg total) by mouth at bedtime.   finasteride (PROSCAR) 5 MG tablet TAKE 1 TABLET BY MOUTH EVERY DAY   fluticasone (FLONASE) 50 MCG/ACT nasal spray SPRAY 1 SPRAY INTO BOTH NOSTRILS DAILY.   lisinopril (ZESTRIL) 40 MG tablet TAKE 1 TABLET BY MOUTH EVERY DAY FOR BLOOD PRESSURE CONTROL   tamsulosin (FLOMAX) 0.4 MG CAPS capsule TAKE 1 CAPSULE BY MOUTH EVERY DAY   No facility-administered encounter medications on file as of 02/17/2022.    Surgical  History: Past Surgical History:  Procedure Laterality Date   cataract and lens implant right eye  2015   CATARACT EXTRACTION, BILATERAL Bilateral 10/2013   EXTRACORPOREAL SHOCK WAVE LITHOTRIPSY Right 10/23/2019   Procedure: EXTRACORPOREAL SHOCK WAVE LITHOTRIPSY (ESWL);  Surgeon: Riki Altes, MD;  Location: [facility] ORS;  Service: Urology;  Laterality: Right;   VASECTOMY      Medical History: Past Medical History:  Diagnosis Date   Hyperlipidemia    Hypertension     Family History: Family History  Problem Relation Age of Onset   Hyperlipidemia Mother    Heart disease Father     Social History   Socioeconomic History   Marital status: Married    Spouse name: Not on file   Number of children: Not on file   Years of education: Not on file   Highest education level: Not on file  Occupational History   Not on file  Tobacco Use   Smoking status: Former    Types: Cigarettes   Smokeless tobacco: Never  Vaping Use   Vaping Use: Never used  Substance and Sexual Activity   Alcohol use: No   Drug use: No   Sexual activity: Not on file  Other Topics Concern   Not on file  Social History Narrative   Not on file   Social Determinants of Health  Financial Resource Strain: Not on file  Food Insecurity: Not on file  Transportation Needs: Not on file  Physical Activity: Not on file  Stress: Not on file  Social Connections: Not on file  Intimate Partner Violence: Not on file      Review of Systems  Constitutional:  Negative for chills, fatigue and unexpected weight change.  HENT:  Negative for congestion, postnasal drip, rhinorrhea, sneezing and sore throat.   Eyes:  Negative for redness.  Respiratory:  Negative for cough, chest tightness and shortness of breath.   Cardiovascular:  Negative for chest pain and palpitations.  Gastrointestinal:  Negative for abdominal pain, constipation, diarrhea, nausea and vomiting.  Genitourinary:  Negative for dysuria and frequency.   Musculoskeletal:  Negative for arthralgias, back pain, joint swelling and neck pain.  Skin:  Negative for rash.  Neurological: Negative.  Negative for tremors and numbness.  Hematological:  Negative for adenopathy. Does not bruise/bleed easily.  Psychiatric/Behavioral:  Negative for behavioral problems (Depression), sleep disturbance and suicidal ideas. The patient is not nervous/anxious.     Vital Signs: BP (!) 140/80   Pulse 60   Temp (!) 97.4 F (36.3 C)   Resp 16   Ht 6' (1.829 m)   Wt 182 lb (82.6 kg)   SpO2 97%   BMI 24.68 kg/m    Physical Exam Vitals and nursing note reviewed.  Constitutional:      General: He is not in acute distress.    Appearance: He is well-developed and normal weight. He is not diaphoretic.  HENT:     Head: Normocephalic and atraumatic.     Right Ear: Ear canal normal.     Left Ear: Ear canal normal.     Mouth/Throat:     Pharynx: No oropharyngeal exudate.  Eyes:     Pupils: Pupils are equal, round, and reactive to light.  Neck:     Thyroid: No thyromegaly.     Vascular: No JVD.     Trachea: No tracheal deviation.  Cardiovascular:     Rate and Rhythm: Normal rate and regular rhythm.     Heart sounds: Normal heart sounds. No murmur heard.    No friction rub. No gallop.  Pulmonary:     Effort: Pulmonary effort is normal. No respiratory distress.     Breath sounds: No wheezing or rales.  Chest:     Chest wall: No tenderness.  Abdominal:     General: Bowel sounds are normal.     Palpations: Abdomen is soft.  Musculoskeletal:        General: Normal range of motion.     Cervical back: Neck supple.  Skin:    General: Skin is warm and dry.  Neurological:     Mental Status: He is alert and oriented to person, place, and time.     Cranial Nerves: No cranial nerve deficit.  Psychiatric:        Behavior: Behavior normal.        Thought Content: Thought content normal.        Judgment: Judgment normal.        Assessment/Plan: 1.  OSA on CPAP Continue use as instructed  2. CPAP use counseling No questions or concerns regarding use or maintenance, does not need any additional supplies.    General Counseling: Michael Everett understanding of the findings of todays visit and agrees with plan of treatment. I have discussed any further diagnostic evaluation that may be needed or ordered today. We also reviewed  his medications today. he has been encouraged to call the office with any questions or concerns that should arise related to todays visit.    No orders of the defined types were placed in this encounter.   No orders of the defined types were placed in this encounter.   Return in about 1 year (around 02/18/2023) for F/U, Kourtland Coopman or DSK for OSA nad CPAP. Marland Kitchen   Total time spent:30 Minutes Time spent includes review of chart, medications, test results, and follow up plan with the patient.   [place] Controlled Substance Database was reviewed by me.  This patient was seen by Sallyanne Kuster, FNP-C in collaboration with Dr. Beverely Risen as a part of collaborative care agreement.   Halo Laski R. Tedd Sias, MSN, FNP-C Internal medicine

## 2022-04-12 ENCOUNTER — Ambulatory Visit
Admission: RE | Admit: 2022-04-12 | Discharge: 2022-04-12 | Disposition: A | Discharge status: Home / Self Care | Payer: Medicare HMO | Source: Ambulatory Visit | Attending: Urology | Admitting: Urology

## 2022-04-12 DIAGNOSIS — N200 Calculus of kidney: Secondary | ICD-10-CM | POA: Insufficient documentation

## 2022-04-12 NOTE — Progress Notes (Signed)
04/13/2022 5:43 PM   Foye Spurling Oct 23, 1945 [id number]  Referring provider: Lavera Guise, [place],  [place] [address]  Urological history 1.  Nephrolithiasis - ESWL on 10/23/2019 for 7 mm right proximal ureteral calculus  - RUS 12/2019 no hydro - stone composition 70% calcium oxalate monohydrate and 30% calcium oxalate dihydrate -KUB (03/2021) no stones seen  2. BPH with LU TS -PSA (03/2021) 0.8 -I PSS 7/2 -finasteride 5 mg daily and tamsulosin 0.4 mg daily  HPI: Michael Everett is a 77 y.o. who presents today for a one year follow up.    He has no urinary complaints at this time.  He has had mid lower back pain that is worse in the morning when waking up but then abates as the day goes on.  He has not passed any fragments or had episodes of renal colic.  Patient denies any modifying or aggravating factors.  Patient denies any gross hematuria, dysuria or suprapubic/flank pain.  Patient denies any fevers, chills, nausea or vomiting.    KUB (03/2022) no stones seen.    IPSS     Row Name 04/13/22 0800         International Prostate Symptom Score   How often have you had the sensation of not emptying your bladder? Less than 1 in 5     How often have you had to urinate less than every two hours? Less than 1 in 5 times     How often have you found you stopped and started again several times when you urinated? Less than half the time     How often have you found it difficult to postpone urination? Less than 1 in 5 times     How often have you had a weak urinary stream? Less than 1 in 5 times     How often have you had to strain to start urination? Less than 1 in 5 times     How many times did you typically get up at night to urinate? None     Total IPSS Score 7       Quality of Life due to urinary symptoms   If you were to spend the rest of your life with your urinary condition just the way it is now how would you feel about that? Mostly Satisfied                 Score:  1-7 Mild 8-19 Moderate 20-35 Severe  PMH: Past Medical History:  Diagnosis Date   Hyperlipidemia    Hypertension     Surgical History: Past Surgical History:  Procedure Laterality Date   cataract and lens implant right eye  2015   CATARACT EXTRACTION, BILATERAL Bilateral 10/2013   EXTRACORPOREAL SHOCK WAVE LITHOTRIPSY Right 10/23/2019   Procedure: EXTRACORPOREAL SHOCK WAVE LITHOTRIPSY (ESWL);  Surgeon: Abbie Sons, MD;  Location: [facility] ORS;  Service: Urology;  Laterality: Right;   VASECTOMY      Home Medications:  Current Outpatient Medications on File Prior to Visit  Medication Sig Dispense Refill   aspirin EC 81 MG tablet Take 81 mg by mouth daily.     atorvastatin (LIPITOR) 10 MG tablet TAKE 1 TABLET (10 MG TOTAL) BY MOUTH DAILY AT 6 PM. 90 tablet 2   finasteride (PROSCAR) 5 MG tablet TAKE 1 TABLET BY MOUTH EVERY DAY 90 tablet 3   fluticasone (FLONASE) 50 MCG/ACT nasal spray SPRAY 1 SPRAY INTO BOTH NOSTRILS DAILY. 48 mL 3  lisinopril (ZESTRIL) 40 MG tablet TAKE 1 TABLET BY MOUTH EVERY DAY FOR BLOOD PRESSURE CONTROL 90 tablet 2   tamsulosin (FLOMAX) 0.4 MG CAPS capsule TAKE 1 CAPSULE BY MOUTH EVERY DAY 90 capsule 1   No current facility-administered medications on file prior to visit.    Allergies: No Known Allergies  Family History: Family History  Problem Relation Age of Onset   Hyperlipidemia Mother    Heart disease Father     Social History:  reports that he has quit smoking. His smoking use included cigarettes. He has never used smokeless tobacco. He reports that he does not drink alcohol and does not use drugs.  ROS: Pertinent ROS in HPI  Physical Exam: BP (!) 151/80   Pulse 60   Ht 6' (1.829 m)   Wt 180 lb (81.6 kg)   BMI 24.41 kg/m   Constitutional:  Well nourished. Alert and oriented, No acute distress. HEENT: [place] AT, moist mucus membranes.  Trachea midline Cardiovascular: No clubbing, cyanosis, or edema. Respiratory:  Normal respiratory effort, no increased work of breathing. Neurologic: Grossly intact, no focal deficits, moving all 4 extremities. Psychiatric: Normal mood and affect.   Laboratory Data:    Latest Ref Rng & Units 10/06/2021    8:53 AM 09/28/2020    9:22 AM 10/21/2019    7:28 PM  CBC  WBC 3.4 - 10.8 x10E3/uL 4.8  5.0  11.0   Hemoglobin 13.0 - 17.7 g/dL 10.2  72.5  36.6   Hematocrit 37.5 - 51.0 % 48.5  46.0  44.9   Platelets 150 - 450 x10E3/uL 157  142  152        Latest Ref Rng & Units 10/06/2021    8:53 AM 09/28/2020    9:22 AM 10/21/2019    7:28 PM  CMP  Glucose 70 - 99 mg/dL 87  92  440   BUN 8 - 27 mg/dL 21  21  24    Creatinine 0.76 - 1.27 mg/dL  3.47  4.25   Sodium 134 - 144 mmol/L 140  140  139   Potassium 3.5 - 5.2 mmol/L 4.6  4.2  4.3   Chloride 96 - 106 mmol/L 102  102  101   CO2 20 - 29 mmol/L 24  24  27    Calcium 8.6 - 10.2 mg/dL 9.7  9.7  9.4   Total Protein 6.0 - 8.5 g/dL 6.9  6.8  7.6   Total Bilirubin 0.0 - 1.2 mg/dL 1.0  0.7  1.5   Alkaline Phos 44 - 121 IU/L 76  85  68   AST 0 - 40 IU/L 20  22  25    ALT 0 - 44 IU/L 23  22  24    I have reviewed the labs.   Pertinent Imaging: Narrative & Impression  CLINICAL DATA:  Kidney stone.   EXAM: ABDOMEN - 1 VIEW   COMPARISON:  Radiographs 04/13/2021 and 12/08/2019.   FINDINGS: Two supine views of the abdomen are submitted. Unchanged nonobstructive bowel gas pattern. No calcifications are seen over the kidneys or expected course of the ureters. A small left pelvic calcification is unchanged from 2021, likely a phlebolith. Mild degenerative changes within the lumbar spine.   IMPRESSION: No radiographic evidence of urolithiasis.     Electronically Signed   By: M.D.   On: 04/15/2022 10:13  I have independently reviewed the films.  See HPI.    Assessment & Plan:    1. BPH with LUTS -  PSA stable -PVR < 300 cc --continue conservative management, avoiding bladder irritants and timed  voiding's -Continue tamsulosin 0.4 mg daily and finasteride 5 mg daily   2. Nephrolithiasis -no episodes of passage of fragments, renal colic or gross heme -KUB no stones seen   Return in about 1 year (around 04/14/2023) for KUB and office visit .  These notes generated with voice recognition software. I apologize for typographical errors.  [place], [place] [address]  [place], [place] [address] [phone]

## 2022-04-13 ENCOUNTER — Ambulatory Visit: Payer: Medicare HMO | Admitting: Urology

## 2022-04-13 VITALS — BP 151/80 | HR 60 | Ht 72.0 in | Wt 180.0 lb

## 2022-04-13 DIAGNOSIS — N401 Enlarged prostate with lower urinary tract symptoms: Secondary | ICD-10-CM | POA: Diagnosis not present

## 2022-04-13 DIAGNOSIS — N200 Calculus of kidney: Secondary | ICD-10-CM

## 2022-04-13 DIAGNOSIS — N138 Other obstructive and reflux uropathy: Secondary | ICD-10-CM | POA: Diagnosis not present

## 2022-04-16 ENCOUNTER — Encounter: Payer: Self-pay | Admitting: Urology

## 2022-06-02 ENCOUNTER — Ambulatory Visit: Payer: Medicare HMO | Admitting: Physician Assistant

## 2022-06-05 ENCOUNTER — Encounter: Payer: Self-pay | Admitting: Physician Assistant

## 2022-06-05 ENCOUNTER — Ambulatory Visit (INDEPENDENT_AMBULATORY_CARE_PROVIDER_SITE_OTHER): Payer: Medicare HMO | Admitting: Physician Assistant

## 2022-06-05 VITALS — BP 160/90 | HR 54 | Temp 98.2°F | Resp 16 | Ht 72.0 in | Wt 186.0 lb

## 2022-06-05 DIAGNOSIS — E782 Mixed hyperlipidemia: Secondary | ICD-10-CM

## 2022-06-05 DIAGNOSIS — R7989 Other specified abnormal findings of blood chemistry: Secondary | ICD-10-CM

## 2022-06-05 DIAGNOSIS — R001 Bradycardia, unspecified: Secondary | ICD-10-CM | POA: Diagnosis not present

## 2022-06-05 DIAGNOSIS — G4733 Obstructive sleep apnea (adult) (pediatric): Secondary | ICD-10-CM

## 2022-06-05 DIAGNOSIS — I10 Essential (primary) hypertension: Secondary | ICD-10-CM

## 2022-06-05 DIAGNOSIS — R5383 Other fatigue: Secondary | ICD-10-CM

## 2022-06-05 NOTE — Progress Notes (Signed)
[facility] [place], [place] [address]  Internal MEDICINE  Office Visit Note  Patient Name: Michael Everett  [id number]  [id number]  Date of Service: 06/05/2022  Chief Complaint  Patient presents with   Follow-up   Hypertension   Hyperlipidemia    HPI Pt is here for routine follow up and has no concerns today -Bp at home 130/80 and has checked various times and always good. He did bring his home cuff in previously and was elevated in office in correlation with elevated manual office reading, this is consistent with white coat syndrome in office -Denies headaches or dizziness, stable bradycardia -Takes lisinopril at night -sleeping well and wearing cpap -He had his vaccines done and no problems with these -Will go ahead and order labs for CPE next visit  Current Medication: Outpatient Encounter Medications as of 06/05/2022  Medication Sig   aspirin EC 81 MG tablet Take 81 mg by mouth daily.   atorvastatin (LIPITOR) 10 MG tablet TAKE 1 TABLET (10 MG TOTAL) BY MOUTH DAILY AT 6 PM.   finasteride (PROSCAR) 5 MG tablet TAKE 1 TABLET BY MOUTH EVERY DAY   fluticasone (FLONASE) 50 MCG/ACT nasal spray SPRAY 1 SPRAY INTO BOTH NOSTRILS DAILY.   lisinopril (ZESTRIL) 40 MG tablet TAKE 1 TABLET BY MOUTH EVERY DAY FOR BLOOD PRESSURE CONTROL   tamsulosin (FLOMAX) 0.4 MG CAPS capsule TAKE 1 CAPSULE BY MOUTH EVERY DAY   No facility-administered encounter medications on file as of 06/05/2022.    Surgical History: Past Surgical History:  Procedure Laterality Date   cataract and lens implant right eye  2015   CATARACT EXTRACTION, BILATERAL Bilateral 10/2013   EXTRACORPOREAL SHOCK WAVE LITHOTRIPSY Right 10/23/2019   Procedure: EXTRACORPOREAL SHOCK WAVE LITHOTRIPSY (ESWL);  Surgeon: Abbie Sons, MD;  Location: [facility] ORS;  Service: Urology;  Laterality: Right;   VASECTOMY      Medical History: Past Medical History:  Diagnosis Date   Hyperlipidemia     Hypertension     Family History: Family History  Problem Relation Age of Onset   Hyperlipidemia Mother    Heart disease Father     Social History   Socioeconomic History   Marital status: Married    Spouse name: Not on file   Number of children: Not on file   Years of education: Not on file   Highest education level: Not on file  Occupational History   Not on file  Tobacco Use   Smoking status: Former    Types: Cigarettes   Smokeless tobacco: Never  Vaping Use   Vaping Use: Never used  Substance and Sexual Activity   Alcohol use: No   Drug use: No   Sexual activity: Not on file  Other Topics Concern   Not on file  Social History Narrative   Not on file   Social Determinants of Health   Financial Resource Strain: Not on file  Food Insecurity: Not on file  Transportation Needs: Not on file  Physical Activity: Not on file  Stress: Not on file  Social Connections: Not on file  Intimate Partner Violence: Not on file      Review of Systems  Constitutional:  Negative for chills, fatigue and unexpected weight change.  HENT:  Negative for congestion, postnasal drip, rhinorrhea, sneezing and sore throat.   Eyes:  Negative for redness.  Respiratory:  Negative for cough, chest tightness and shortness of breath.   Cardiovascular:  Negative for chest pain and palpitations.  Gastrointestinal:  Negative for abdominal pain, constipation, diarrhea, nausea and vomiting.  Genitourinary:  Negative for dysuria and frequency.  Musculoskeletal:  Negative for arthralgias, back pain, joint swelling and neck pain.  Skin:  Negative for rash.  Neurological: Negative.  Negative for tremors and numbness.  Hematological:  Negative for adenopathy. Does not bruise/bleed easily.  Psychiatric/Behavioral:  Negative for behavioral problems (Depression), sleep disturbance and suicidal ideas. The patient is not nervous/anxious.     Vital Signs: BP (!) 160/90   Pulse (!) 54   Temp 98.2 F  (36.8 C)   Resp 16   Ht 6' (1.829 m)   Wt 186 lb (84.4 kg)   SpO2 99%   BMI 25.23 kg/m    Physical Exam Vitals and nursing note reviewed.  Constitutional:      General: He is not in acute distress.    Appearance: He is well-developed and normal weight. He is not diaphoretic.  HENT:     Head: Normocephalic and atraumatic.     Right Ear: Ear canal normal.     Left Ear: Ear canal normal.     Mouth/Throat:     Pharynx: No oropharyngeal exudate.  Eyes:     Pupils: Pupils are equal, round, and reactive to light.  Neck:     Thyroid: No thyromegaly.     Vascular: No JVD.     Trachea: No tracheal deviation.  Cardiovascular:     Rate and Rhythm: Normal rate and regular rhythm.     Heart sounds: Normal heart sounds. No murmur heard.    No friction rub. No gallop.  Pulmonary:     Effort: Pulmonary effort is normal. No respiratory distress.     Breath sounds: No wheezing or rales.  Chest:     Chest wall: No tenderness.  Abdominal:     General: Bowel sounds are normal.     Palpations: Abdomen is soft.  Musculoskeletal:        General: Normal range of motion.     Cervical back: Neck supple.  Skin:    General: Skin is warm and dry.  Neurological:     Mental Status: He is alert and oriented to person, place, and time.     Cranial Nerves: No cranial nerve deficit.  Psychiatric:        Behavior: Behavior normal.        Thought Content: Thought content normal.        Judgment: Judgment normal.        Assessment/Plan: 1. White coat syndrome with diagnosis of hypertension Elevated in office, but stable at home and will continue to monitor  2. Bradycardia Stable and asymptomatic - TSH + free T4  3. OSA on CPAP Continue cpap  4. Mixed hyperlipidemia - Lipid Panel With LDL/HDL Ratio  5. Abnormal thyroid blood test - TSH + free T4  6. Other fatigue - CBC w/Diff/Platelet - Comprehensive metabolic panel - TSH + free T4 - Lipid Panel With LDL/HDL Ratio   General  Counseling: maxden hailu understanding of the findings of todays visit and agrees with plan of treatment. I have discussed any further diagnostic evaluation that may be needed or ordered today. We also reviewed his medications today. he has been encouraged to call the office with any questions or concerns that should arise related to todays visit.    Orders Placed This Encounter  Procedures   CBC w/Diff/Platelet   Comprehensive metabolic panel   TSH + free T4   Lipid Panel  With LDL/HDL Ratio    No orders of the defined types were placed in this encounter.   This patient was seen by Drema Dallas, PA-C in collaboration with Dr. Clayborn Bigness as a part of collaborative care agreement.   Total time spent:30 Minutes Time spent includes review of chart, medications, test results, and follow up plan with the patient.      Dr Lavera Guise Internal medicine

## 2022-06-09 ENCOUNTER — Other Ambulatory Visit: Payer: Self-pay | Admitting: Urology

## 2022-06-09 DIAGNOSIS — N401 Enlarged prostate with lower urinary tract symptoms: Secondary | ICD-10-CM

## 2022-08-04 ENCOUNTER — Other Ambulatory Visit: Payer: Self-pay | Admitting: Physician Assistant

## 2022-10-13 LAB — CBC WITH DIFFERENTIAL/PLATELET
Lymphs: 23 %
Monocytes: 10 %
Neutrophils: 62 %

## 2022-10-20 ENCOUNTER — Ambulatory Visit: Payer: Medicare HMO | Admitting: Physician Assistant

## 2022-10-27 ENCOUNTER — Encounter: Payer: Self-pay | Admitting: Physician Assistant

## 2022-10-27 ENCOUNTER — Ambulatory Visit: Payer: Medicare HMO | Admitting: Physician Assistant

## 2022-10-27 VITALS — BP 138/88 | HR 57 | Temp 97.8°F | Resp 16 | Ht 72.0 in | Wt 190.0 lb

## 2022-10-27 DIAGNOSIS — Z0001 Encounter for general adult medical examination with abnormal findings: Secondary | ICD-10-CM | POA: Diagnosis not present

## 2022-10-27 DIAGNOSIS — I10 Essential (primary) hypertension: Secondary | ICD-10-CM

## 2022-10-27 DIAGNOSIS — R001 Bradycardia, unspecified: Secondary | ICD-10-CM | POA: Diagnosis not present

## 2022-10-27 DIAGNOSIS — R300 Dysuria: Secondary | ICD-10-CM

## 2022-10-27 DIAGNOSIS — G4733 Obstructive sleep apnea (adult) (pediatric): Secondary | ICD-10-CM | POA: Diagnosis not present

## 2022-10-27 NOTE — Progress Notes (Signed)
[facility] [address] [place], [place] [address]  Internal MEDICINE  Office Visit Note  Patient Name: Michael Everett  [id number]  [id number]  Date of Service: 11/06/2022  Chief Complaint  Patient presents with   Medicare Wellness   Hypertension   Hyperlipidemia    HPI Michael Everett presents for an annual well visit and physical exam.  Well-appearing 77 y.o. male -BP stable, stable bradycardia, still no dizziness or lightheadedness. Pt defers an EKG today -Labs all look good -Sleeping ok -Good appetite, not much exercise. Does mow lawn -wears cpap nightly     10/27/2022    9:19 AM 10/14/2021   10:00 AM 10/08/2020   10:23 AM  MMSE - Mini Mental State Exam  Orientation to time 5 5 5   Orientation to Place 5 5 5   Registration 3 3 3   Attention/ Calculation 5 5 5   Recall 3 3 3   Language- name 2 objects 2 2 2   Language- repeat 1 1 1   Language- follow 3 step command 3 3 3   Language- read & follow direction 1 1 1   Write a sentence 1 1 1   Copy design 1 1 1   Total score 30 30 30     Functional Status Survey: Is the patient deaf or have difficulty hearing?: No Does the patient have difficulty seeing, even when wearing glasses/contacts?: No Does the patient have difficulty concentrating, remembering, or making decisions?: No Does the patient have difficulty walking or climbing stairs?: No Does the patient have difficulty dressing or bathing?: No Does the patient have difficulty doing errands alone such as visiting a doctor's office or shopping?: No     10/08/2020   10:21 AM 04/07/2021    9:40 AM 10/14/2021    9:59 AM 06/05/2022    8:33 AM 10/27/2022    9:20 AM  Fall Risk  Falls in the past year? 0 0 0 0 0  Patient at Risk for Falls Due to No Fall Risks   No Fall Risks No Fall Risks  Fall risk Follow up Falls evaluation completed   Falls evaluation completed Falls evaluation completed       10/27/2022    9:20 AM  Depression screen PHQ 2/9  Decreased Interest 0   Down, Depressed, Hopeless 0  PHQ - 2 Score 0        No data to display            Current Medication: Outpatient Encounter Medications as of 10/27/2022  Medication Sig   aspirin EC 81 MG tablet Take 81 mg by mouth daily.   atorvastatin (LIPITOR) 10 MG tablet TAKE 1 TABLET (10 MG TOTAL) BY MOUTH DAILY AT 6 PM.   finasteride (PROSCAR) 5 MG tablet TAKE 1 TABLET BY MOUTH EVERY DAY   fluticasone (FLONASE) 50 MCG/ACT nasal spray SPRAY 1 SPRAY INTO BOTH NOSTRILS DAILY.   lisinopril (ZESTRIL) 40 MG tablet TAKE 1 TABLET BY MOUTH EVERY DAY FOR BLOOD PRESSURE CONTROL   tamsulosin (FLOMAX) 0.4 MG CAPS capsule TAKE 1 CAPSULE BY MOUTH EVERY DAY   No facility-administered encounter medications on file as of 10/27/2022.    Surgical History: Past Surgical History:  Procedure Laterality Date   cataract and lens implant right eye  2015   CATARACT EXTRACTION, BILATERAL Bilateral 10/2013   EXTRACORPOREAL SHOCK WAVE LITHOTRIPSY Right 10/23/2019   Procedure: EXTRACORPOREAL SHOCK WAVE LITHOTRIPSY (ESWL);  Surgeon: Riki Altes, MD;  Location: [facility] ORS;  Service: Urology;  Laterality: Right;   VASECTOMY  Medical History: Past Medical History:  Diagnosis Date   Hyperlipidemia    Hypertension     Family History: Family History  Problem Relation Age of Onset   Hyperlipidemia Mother    Heart disease Father     Social History   Socioeconomic History   Marital status: Married    Spouse name: Not on file   Number of children: Not on file   Years of education: Not on file   Highest education level: Not on file  Occupational History   Not on file  Tobacco Use   Smoking status: Former    Types: Cigarettes   Smokeless tobacco: Never  Vaping Use   Vaping status: Never Used  Substance and Sexual Activity   Alcohol use: No   Drug use: No   Sexual activity: Not on file  Other Topics Concern   Not on file  Social History Narrative   Not on file   Social Determinants of Health    Financial Resource Strain: Not on file  Food Insecurity: Not on file  Transportation Needs: Not on file  Physical Activity: Not on file  Stress: Not on file  Social Connections: Not on file  Intimate Partner Violence: Not on file      Review of Systems  Constitutional:  Negative for chills, fatigue and unexpected weight change.  HENT:  Negative for congestion, postnasal drip, rhinorrhea, sneezing and sore throat.   Eyes:  Negative for redness.  Respiratory:  Negative for cough, chest tightness and shortness of breath.   Cardiovascular:  Negative for chest pain and palpitations.  Gastrointestinal:  Negative for abdominal pain, constipation, diarrhea, nausea and vomiting.  Genitourinary:  Negative for dysuria and frequency.  Musculoskeletal:  Negative for arthralgias, back pain, joint swelling and neck pain.  Skin:  Negative for rash.  Neurological: Negative.  Negative for tremors and numbness.  Hematological:  Negative for adenopathy. Does not bruise/bleed easily.  Psychiatric/Behavioral:  Negative for behavioral problems (Depression), sleep disturbance and suicidal ideas. The patient is not nervous/anxious.     Vital Signs: BP 138/88   Pulse (!) 57   Temp 97.8 F (36.6 C)   Resp 16   Ht 6' (1.829 m)   Wt 190 lb (86.2 kg)   SpO2 97%   BMI 25.77 kg/m    Physical Exam Vitals and nursing note reviewed.  Constitutional:      General: He is not in acute distress.    Appearance: He is well-developed and normal weight. He is not diaphoretic.  HENT:     Head: Normocephalic and atraumatic.     Right Ear: Ear canal normal.     Left Ear: Ear canal normal.     Mouth/Throat:     Pharynx: No oropharyngeal exudate.  Eyes:     Pupils: Pupils are equal, round, and reactive to light.  Neck:     Thyroid: No thyromegaly.     Vascular: No JVD.     Trachea: No tracheal deviation.  Cardiovascular:     Rate and Rhythm: Regular rhythm. Bradycardia present.     Heart sounds: Normal  heart sounds. No murmur heard.    No friction rub. No gallop.  Pulmonary:     Effort: Pulmonary effort is normal. No respiratory distress.     Breath sounds: No wheezing or rales.  Chest:     Chest wall: No tenderness.  Abdominal:     General: Bowel sounds are normal.     Palpations: Abdomen is soft.  Tenderness: There is no abdominal tenderness.  Musculoskeletal:        General: Normal range of motion.     Cervical back: Neck supple.  Skin:    General: Skin is warm and dry.  Neurological:     Mental Status: He is alert and oriented to person, place, and time.     Cranial Nerves: No cranial nerve deficit.  Psychiatric:        Behavior: Behavior normal.        Thought Content: Thought content normal.        Judgment: Judgment normal.        Assessment/Plan: 1. Encounter for general adult medical examination with abnormal findings CPE performed, labs reviewed  2. Primary hypertension Stable, continue current medication  3. Bradycardia Pt is asymptomatic and is not a new finding, defers EKG today, but will notify office if he changes his mind or any symptoms arise  4. OSA on CPAP Continue cpap nightly    General Counseling: narin kurlander understanding of the findings of todays visit and agrees with plan of treatment. I have discussed any further diagnostic evaluation that may be needed or ordered today. We also reviewed his medications today. he has been encouraged to call the office with any questions or concerns that should arise related to todays visit.    No orders of the defined types were placed in this encounter.   No orders of the defined types were placed in this encounter.   Return in about 6 months (around 04/29/2023) for general follow up.   Total time spent:35 Minutes Time spent includes review of chart, medications, test results, and follow up plan with the patient.   [place] Controlled Substance Database was reviewed by me.  This patient was  seen by Lynn Ito, PA-C in collaboration with Dr. Beverely Risen as a part of collaborative care agreement.  Lynn Ito, PA-C Internal medicine

## 2022-10-27 NOTE — Progress Notes (Deleted)
[facility] [address] [place], [place] [address]  Internal MEDICINE  Office Visit Note  Patient Name: Michael Everett  [id number]  [id number]  Date of Service: 10/27/2022  Chief Complaint  Patient presents with   Medicare Wellness   Hypertension   Hyperlipidemia    HPI Pt is here for routine follow up -BP stable, no dizziness or lightheadedness -Labs all look good -Sleeping ok -Good appetite, not much exercise. Does mow lawn   Current Medication: Outpatient Encounter Medications as of 10/27/2022  Medication Sig   aspirin EC 81 MG tablet Take 81 mg by mouth daily.   atorvastatin (LIPITOR) 10 MG tablet TAKE 1 TABLET (10 MG TOTAL) BY MOUTH DAILY AT 6 PM.   finasteride (PROSCAR) 5 MG tablet TAKE 1 TABLET BY MOUTH EVERY DAY   fluticasone (FLONASE) 50 MCG/ACT nasal spray SPRAY 1 SPRAY INTO BOTH NOSTRILS DAILY.   lisinopril (ZESTRIL) 40 MG tablet TAKE 1 TABLET BY MOUTH EVERY DAY FOR BLOOD PRESSURE CONTROL   tamsulosin (FLOMAX) 0.4 MG CAPS capsule TAKE 1 CAPSULE BY MOUTH EVERY DAY   No facility-administered encounter medications on file as of 10/27/2022.    Surgical History: Past Surgical History:  Procedure Laterality Date   cataract and lens implant right eye  2015   CATARACT EXTRACTION, BILATERAL Bilateral 10/2013   EXTRACORPOREAL SHOCK WAVE LITHOTRIPSY Right 10/23/2019   Procedure: EXTRACORPOREAL SHOCK WAVE LITHOTRIPSY (ESWL);  Surgeon: Riki Altes, MD;  Location: [facility] ORS;  Service: Urology;  Laterality: Right;   VASECTOMY      Medical History: Past Medical History:  Diagnosis Date   Hyperlipidemia    Hypertension     Family History: Family History  Problem Relation Age of Onset   Hyperlipidemia Mother    Heart disease Father     Social History   Socioeconomic History   Marital status: Married    Spouse name: Not on file   Number of children: Not on file   Years of education: Not on file   Highest education level: Not on file   Occupational History   Not on file  Tobacco Use   Smoking status: Former    Types: Cigarettes   Smokeless tobacco: Never  Vaping Use   Vaping status: Never Used  Substance and Sexual Activity   Alcohol use: No   Drug use: No   Sexual activity: Not on file  Other Topics Concern   Not on file  Social History Narrative   Not on file   Social Determinants of Health   Financial Resource Strain: Not on file  Food Insecurity: Not on file  Transportation Needs: Not on file  Physical Activity: Not on file  Stress: Not on file  Social Connections: Not on file  Intimate Partner Violence: Not on file      Review of Systems  Vital Signs: BP 138/88   Pulse (!) 57   Temp 97.8 F (36.6 C)   Resp 16   Ht 6' (1.829 m)   Wt 190 lb (86.2 kg)   SpO2 97%   BMI 25.77 kg/m    Physical Exam     Assessment/Plan:   General Counseling: [name] verbalizes understanding of the findings of todays visit and agrees with plan of treatment. I have discussed any further diagnostic evaluation that may be needed or ordered today. We also reviewed his medications today. he has been encouraged to call the office with any questions or concerns that should arise related to todays visit.  Orders Placed This Encounter  Procedures   UA/M w/rflx Culture, Routine    No orders of the defined types were placed in this encounter.   This patient was seen by Lynn Ito, PA-C in collaboration with Dr. Beverely Risen as a part of collaborative care agreement.   Total time spent:*** Minutes Time spent includes review of chart, medications, test results, and follow up plan with the patient.      Dr Lyndon Code Internal medicine

## 2022-11-24 IMAGING — CR DG ABDOMEN 1V
1 series · 2 of 2 positions shown · non-contrast
Comparison: 12/08/2019

CLINICAL DATA: Kidney stones.  Annual follow-up

EXAM:
ABDOMEN - 1 VIEW

[Series 1: dg abd 1 view · 0.14mm/px · 2 of 2 slices shown]
[im 1/2]
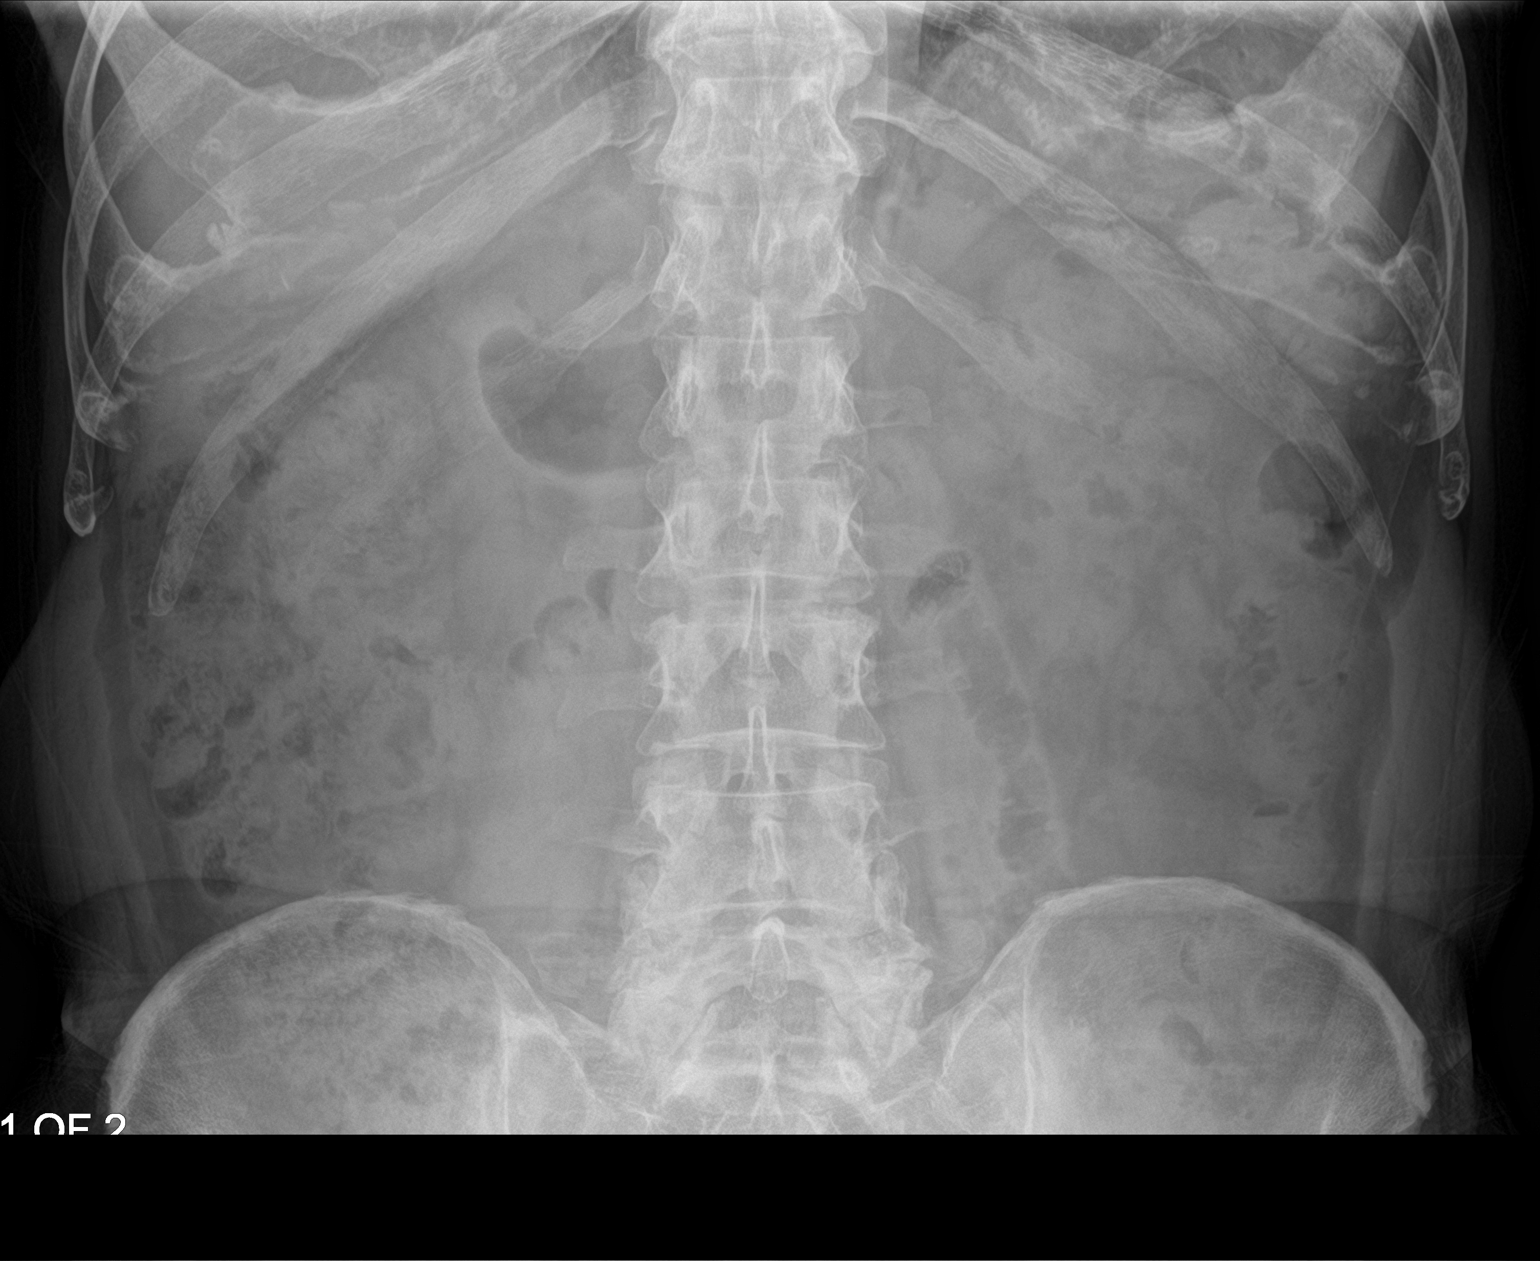
[im 2/2]
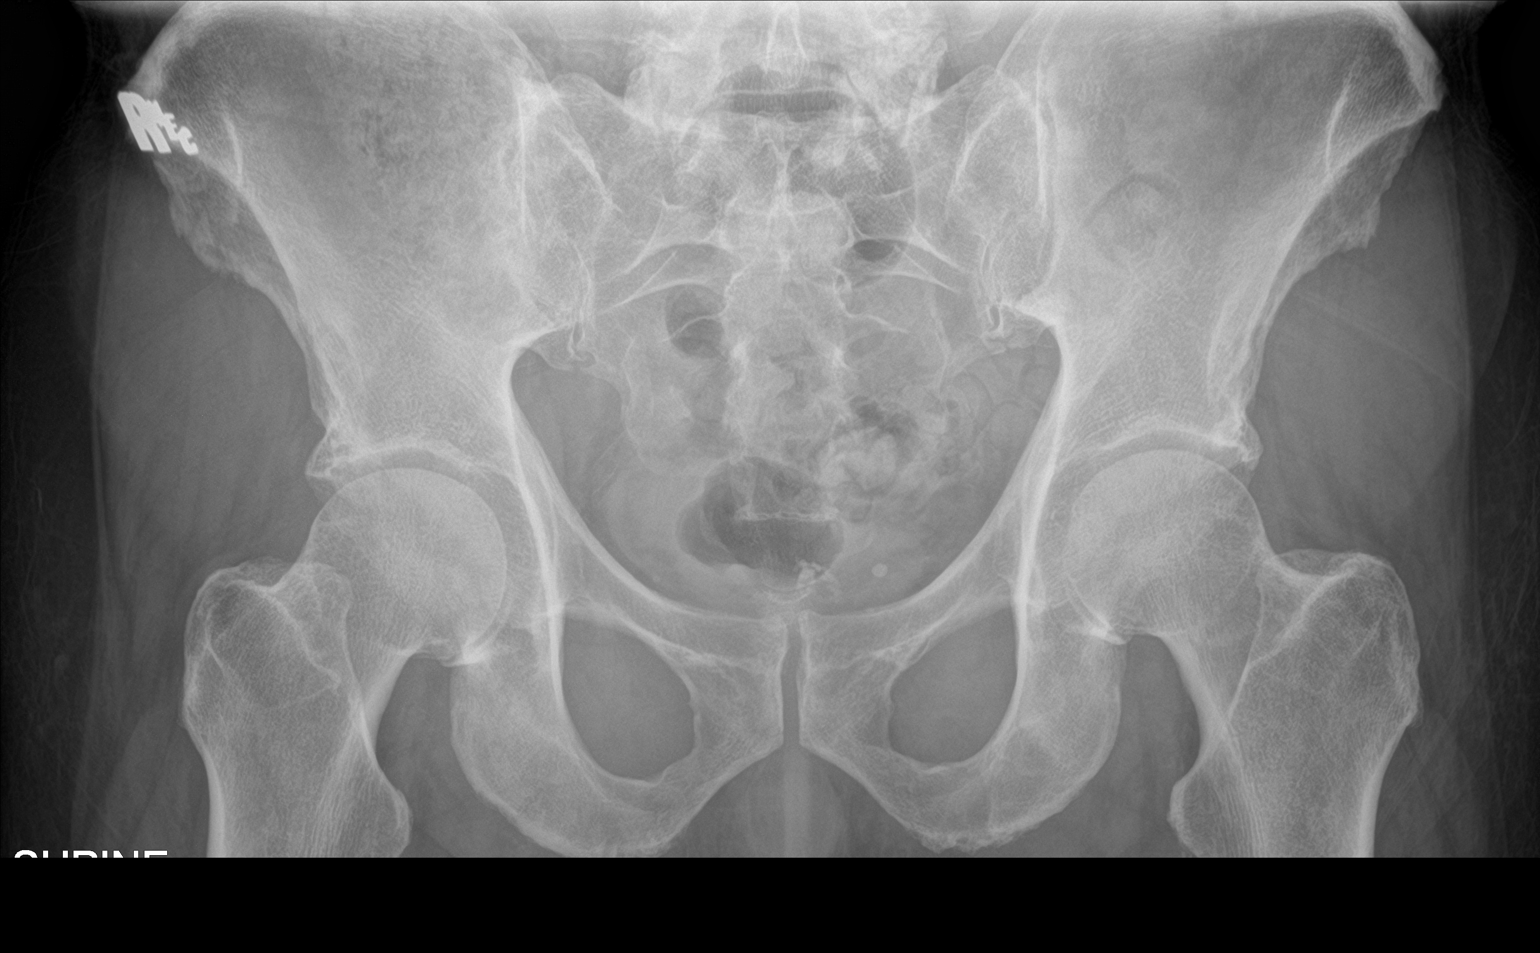

[2 of 2 positions shown; findings below may reference images not displayed]

FINDINGS: There is a non obstructive bowel gas pattern. No supine evidence of
free air. No organomegaly or suspicious calcification. No acute bony
abnormality.
IMPRESSION: No visible urinary tract calculi.

No acute findings.

## 2022-12-01 ENCOUNTER — Other Ambulatory Visit: Payer: Self-pay | Admitting: Urology

## 2022-12-01 DIAGNOSIS — N401 Enlarged prostate with lower urinary tract symptoms: Secondary | ICD-10-CM

## 2022-12-12 ENCOUNTER — Other Ambulatory Visit: Payer: Self-pay | Admitting: Physician Assistant

## 2022-12-25 ENCOUNTER — Telehealth: Payer: Self-pay | Admitting: Urology

## 2022-12-25 DIAGNOSIS — N401 Enlarged prostate with lower urinary tract symptoms: Secondary | ICD-10-CM

## 2022-12-25 MED ORDER — TAMSULOSIN HCL 0.4 MG PO CAPS: 0.4000 mg | ORAL_CAPSULE | Freq: Every day | ORAL | 3 refills | Status: DC

## 2022-12-25 NOTE — Telephone Encounter (Signed)
Patient called to request refill for Tamsulosin 0.4 mg caps. Pharmacy is [facility] on [address].

## 2023-02-23 ENCOUNTER — Ambulatory Visit: Payer: Medicare HMO | Admitting: Physician Assistant

## 2023-02-23 ENCOUNTER — Encounter: Payer: Self-pay | Admitting: Physician Assistant

## 2023-02-23 VITALS — BP 120/65 | HR 81 | Temp 98.3°F | Resp 16 | Ht 72.0 in | Wt 193.0 lb

## 2023-02-23 DIAGNOSIS — Z7189 Other specified counseling: Secondary | ICD-10-CM | POA: Diagnosis not present

## 2023-02-23 DIAGNOSIS — G4733 Obstructive sleep apnea (adult) (pediatric): Secondary | ICD-10-CM

## 2023-02-23 NOTE — Progress Notes (Signed)
[facility] [address] [place], [place] [address]  Pulmonary Sleep Medicine   Office Visit Note  Patient Name: Michael Everett DOB: 02-18-1946 MRN [id number]  Date of Service: 02/23/2023  Complaints/HPI: Pt is here routine pulmonary follow up for OSA on CPAP. Using cpap every night and benefiting from use. Uses [employer] for supplies and changes regularly. Denies headaches, SOB, or dryness. Feels rested and avg about 9 hours per night. Unfortunately no download is available today.  ROS  General: (-) fever, (-) chills, (-) night sweats, (-) weakness Skin: (-) rashes, (-) itching,. Eyes: (-) visual changes, (-) redness, (-) itching. Nose and Sinuses: (-) nasal stuffiness or itchiness, (-) postnasal drip, (-) nosebleeds, (-) sinus trouble. Mouth and Throat: (-) sore throat, (-) hoarseness. Neck: (-) swollen glands, (-) enlarged thyroid, (-) neck pain. Respiratory: - cough, (-) bloody sputum, - shortness of breath, - wheezing. Cardiovascular: - ankle swelling, (-) chest pain. Lymphatic: (-) lymph node enlargement. Neurologic: (-) numbness, (-) tingling. Psychiatric: (-) anxiety, (-) depression   Current Medication: Outpatient Encounter Medications as of 02/23/2023  Medication Sig   aspirin EC 81 MG tablet Take 81 mg by mouth daily.   atorvastatin (LIPITOR) 10 MG tablet TAKE 1 TABLET (10 MG TOTAL) BY MOUTH DAILY AT 6 PM.   finasteride (PROSCAR) 5 MG tablet TAKE 1 TABLET BY MOUTH EVERY DAY   fluticasone (FLONASE) 50 MCG/ACT nasal spray INSTILL 1 SPRAY INTO BOTH NOSTRILS DAILY   lisinopril (ZESTRIL) 40 MG tablet TAKE 1 TABLET BY MOUTH EVERY DAY FOR BLOOD PRESSURE CONTROL   tamsulosin (FLOMAX) 0.4 MG CAPS capsule Take 1 capsule (0.4 mg total) by mouth daily.   No facility-administered encounter medications on file as of 02/23/2023.    Surgical History: Past Surgical History:  Procedure Laterality Date   cataract and lens implant right eye  2015   CATARACT  EXTRACTION, BILATERAL Bilateral 10/2013   EXTRACORPOREAL SHOCK WAVE LITHOTRIPSY Right 10/23/2019   Procedure: EXTRACORPOREAL SHOCK WAVE LITHOTRIPSY (ESWL);  Surgeon: Riki Altes, MD;  Location: [facility] ORS;  Service: Urology;  Laterality: Right;   VASECTOMY      Medical History: Past Medical History:  Diagnosis Date   Hyperlipidemia    Hypertension     Family History: Family History  Problem Relation Age of Onset   Hyperlipidemia Mother    Heart disease Father     Social History: Social History   Socioeconomic History   Marital status: Married    Spouse name: Not on file   Number of children: Not on file   Years of education: Not on file   Highest education level: Not on file  Occupational History   Not on file  Tobacco Use   Smoking status: Former    Types: Cigarettes   Smokeless tobacco: Never  Vaping Use   Vaping status: Never Used  Substance and Sexual Activity   Alcohol use: No   Drug use: No   Sexual activity: Not on file  Other Topics Concern   Not on file  Social History Narrative   Not on file   Social Determinants of Health   Financial Resource Strain: Not on file  Food Insecurity: Not on file  Transportation Needs: Not on file  Physical Activity: Not on file  Stress: Not on file  Social Connections: Not on file  Intimate Partner Violence: Not on file    Vital Signs: Blood pressure 120/65, pulse 81, temperature 98.3 F (36.8 C), resp. rate 16, height 6' (1.829  m), weight 193 lb (87.5 kg), SpO2 99%.  Examination: General Appearance: The patient is well-developed, well-nourished, and in no distress. Skin: Gross inspection of skin unremarkable. Head: normocephalic, no gross deformities. Eyes: no gross deformities noted. ENT: ears appear grossly normal no exudates. Neck: Supple. No thyromegaly. No LAD. Respiratory: Lungs clear to auscultation. Cardiovascular: Normal S1 and S2 without murmur or rub. Extremities: No cyanosis. pulses are  equal. Neurologic: Alert and oriented. No involuntary movements.  LABS: No results found for this or any previous visit (from the past 2160 hour(s)).  Radiology: Abdomen 1 view (KUB)  Result Date: 04/15/2022 CLINICAL DATA:  Kidney stone. EXAM: ABDOMEN - 1 VIEW COMPARISON:  Radiographs 04/13/2021 and 12/08/2019. FINDINGS: Two supine views of the abdomen are submitted. Unchanged nonobstructive bowel gas pattern. No calcifications are seen over the kidneys or expected course of the ureters. A small left pelvic calcification is unchanged from 2021, likely a phlebolith. Mild degenerative changes within the lumbar spine. IMPRESSION: No radiographic evidence of urolithiasis. Electronically Signed   By: Carey Bullocks M.D.   On: 04/15/2022 10:13    No results found.  No results found.    Assessment and Plan: Patient Active Problem List   Diagnosis Date Noted   Encounter for general adult medical examination with abnormal findings 09/29/2019   Bradycardia 03/29/2019   OSA on CPAP 03/29/2019   Diastolic dysfunction 11/17/2018   Snoring 11/17/2018   Special screening for malignant neoplasm of prostate 09/17/2017   Need for vaccination against Streptococcus pneumoniae using pneumococcal conjugate vaccine 13 09/17/2017   Dysuria 09/17/2017   Hypertension 04/29/2017   Hyperlipidemia 04/29/2017   Cervicalgia 04/29/2017   1. OSA on CPAP Continue nightly use  2. CPAP use counseling CPAP couseling-Discussed importance of adequate CPAP use as well as proper care and cleaning techniques of machine and all supplies.   General Counseling: I have discussed the findings of the evaluation and examination with Alden Server.  I have also discussed any further diagnostic evaluation thatmay be needed or ordered today. Overton verbalizes understanding of the findings of todays visit. We also reviewed his medications today and discussed drug interactions and side effects including but not limited excessive  drowsiness and altered mental states. We also discussed that there is always a risk not just to him but also people around him. he has been encouraged to call the office with any questions or concerns that should arise related to todays visit.  No orders of the defined types were placed in this encounter.    Time spent: 25  I have personally obtained a history, examined the patient, evaluated laboratory and imaging results, formulated the assessment and plan and placed orders. This patient was seen by Lynn Ito, PA-C in collaboration with Dr. Freda Munro as a part of collaborative care agreement.     Yevonne Pax, MD [facility] Pulmonary and Critical Care Sleep medicine

## 2023-04-13 ENCOUNTER — Ambulatory Visit: Payer: Medicare HMO | Admitting: Urology

## 2023-04-20 ENCOUNTER — Other Ambulatory Visit: Payer: Self-pay | Admitting: Physician Assistant

## 2023-04-21 ENCOUNTER — Other Ambulatory Visit: Payer: Self-pay | Admitting: Physician Assistant

## 2023-04-22 ENCOUNTER — Other Ambulatory Visit: Payer: Self-pay | Admitting: Urology

## 2023-04-22 DIAGNOSIS — N200 Calculus of kidney: Secondary | ICD-10-CM

## 2023-04-22 NOTE — Progress Notes (Signed)
 04/24/2023 11:11 AM   Jackee KANDICE Essex 10/18/1945 [id number]  Referring provider: Kristina Tinnie POUR, PA-C [address] [place],  [place] [address]  Urological history 1.  Nephrolithiasis - ESWL on 10/23/2019 for 7 mm right proximal ureteral calculus  - RUS 12/2019 no hydro - stone composition 70% calcium  oxalate monohydrate and 30% calcium  oxalate dihydrate -KUB (03/2022) no stones seen  2. BPH with LU TS -PSA (03/2021) 0.8 -finasteride  5 mg daily and tamsulosin  0.4 mg daily  HPI: BHAVIK CABINESS is a 78 y.o. who presents today for a one year follow up.    Previous records reviewed.     KUB no stones seen.   I PSS 4/2  He has been having some droplets of urine in his underwear without his awareness.  He has been happening for the last few months.  He believes it may be the result of him putting on a little bit of weight.  Patient denies any modifying or aggravating factors.  Patient denies any recent UTI's, gross hematuria, dysuria or suprapubic/flank pain.  Patient denies any fevers, chills, nausea or vomiting.     IPSS     Row Name 04/24/23 1000         International Prostate Symptom Score   How often have you had the sensation of not emptying your bladder? Not at All     How often have you had to urinate less than every two hours? Less than half the time     How often have you found you stopped and started again several times when you urinated? Not at All     How often have you found it difficult to postpone urination? Less than 1 in 5 times     How often have you had a weak urinary stream? Not at All     How often have you had to strain to start urination? Less than 1 in 5 times     How many times did you typically get up at night to urinate? None     Total IPSS Score 4       Quality of Life due to urinary symptoms   If you were to spend the rest of your life with your urinary condition just the way it is now how would you feel about that? Mostly Satisfied              Score:  1-7 Mild 8-19 Moderate 20-35 Severe  PMH: Past Medical History:  Diagnosis Date   Hyperlipidemia    Hypertension     Surgical History: Past Surgical History:  Procedure Laterality Date   cataract and lens implant right eye  2015   CATARACT EXTRACTION, BILATERAL Bilateral 10/2013   EXTRACORPOREAL SHOCK WAVE LITHOTRIPSY Right 10/23/2019   Procedure: EXTRACORPOREAL SHOCK WAVE LITHOTRIPSY (ESWL);  Surgeon: Twylla Glendia BROCKS, MD;  Location: [facility] ORS;  Service: Urology;  Laterality: Right;   VASECTOMY      Home Medications:  Current Outpatient Medications on File Prior to Visit  Medication Sig Dispense Refill   aspirin EC 81 MG tablet Take 81 mg by mouth daily.     atorvastatin  (LIPITOR) 10 MG tablet TAKE 1 TABLET (10 MG TOTAL) BY MOUTH DAILY AT 6 PM. 90 tablet 2   finasteride  (PROSCAR ) 5 MG tablet TAKE 1 TABLET BY MOUTH EVERY DAY 90 tablet 3   fluticasone  (FLONASE ) 50 MCG/ACT nasal spray INSTILL 1 SPRAY INTO BOTH NOSTRILS DAILY 48 mL 3   lisinopril  (ZESTRIL ) 40 MG  tablet TAKE 1 TABLET BY MOUTH EVERY DAY FOR BLOOD PRESSURE CONTROL 90 tablet 2   tamsulosin  (FLOMAX ) 0.4 MG CAPS capsule Take 1 capsule (0.4 mg total) by mouth daily. 90 capsule 3   No current facility-administered medications on file prior to visit.    Allergies: No Known Allergies  Family History: Family History  Problem Relation Age of Onset   Hyperlipidemia Mother    Heart disease Father     Social History:  reports that he has quit smoking. His smoking use included cigarettes. He has never used smokeless tobacco. He reports that he does not drink alcohol and does not use drugs.  ROS: Pertinent ROS in HPI  Physical Exam: BP (!) 153/89   Pulse 64   Ht 5' 11 (1.803 m)   Wt 190 lb (86.2 kg)   BMI 26.50 kg/m   Constitutional:  Well nourished. Alert and oriented, No acute distress. HEENT: [place] AT, moist mucus membranes.  Trachea midline, no masses. Cardiovascular: No clubbing,  cyanosis, or edema. Respiratory: Normal respiratory effort, no increased work of breathing. Neurologic: Grossly intact, no focal deficits, moving all 4 extremities. Psychiatric: Normal mood and affect.    Laboratory Data:    Latest Ref Rng & Units 10/12/2022    8:47 AM 10/06/2021    8:53 AM 09/28/2020    9:22 AM  CBC  WBC 3.4 - 10.8 x10E3/uL 5.1  4.8  5.0   Hemoglobin 13.0 - 17.7 g/dL 84.3  84.0  84.6   Hematocrit 37.5 - 51.0 % 46.5  48.5  46.0   Platelets 150 - 450 x10E3/uL 153  157  142        Latest Ref Rng & Units 10/12/2022    8:47 AM 10/06/2021    8:53 AM 09/28/2020    9:22 AM  CMP  Glucose 70 - 99 mg/dL 898  87  92   BUN 8 - 27 mg/dL 25  21  21    Creatinine 0.76 - 1.27 mg/dL 8.84  8.92  8.89   Sodium 134 - 144 mmol/L 141  140  140   Potassium 3.5 - 5.2 mmol/L 4.5  4.6  4.2   Chloride 96 - 106 mmol/L 104  102  102   CO2 20 - 29 mmol/L 24  24  24    Calcium  8.6 - 10.2 mg/dL 9.4  9.7  9.7   Total Protein 6.0 - 8.5 g/dL 6.7  6.9  6.8   Total Bilirubin 0.0 - 1.2 mg/dL 0.8  1.0  0.7   Alkaline Phos 44 - 121 IU/L 79  76  85   AST 0 - 40 IU/L 18  20  22    ALT 0 - 44 IU/L 20  23  22    I have reviewed the labs.   Pertinent Imaging: KUB no stone seen, radiologist interpretation still pending I have independently reviewed the films.  See HPI.    Assessment & Plan:    1. BPH with LUTS -continue conservative management, avoiding bladder irritants and timed voiding's -Continue tamsulosin  0.4 mg daily and finasteride  5 mg daily   2. Nephrolithiasis -no episodes of passage of fragments, renal colic or gross heme -KUB no stones seen   3.  Postvoid dribbling -Discussed that yes it could be from a little bit of weight gain, but also as we get older a pelvic muscles do weaken, so I encouraged him to engage in Kegel exercises and gave him the video and the handout on how to do  those exercises I also explained that tamsulosin  can weaken the bladder neck and he may want to try to  discontinue the tamsulosin  for a week or 2 to see if the dripping abates, he will consider that  Return in about 1 year (around 04/23/2024) for KUB, I PSS .  These notes generated with voice recognition software. I apologize for typographical errors.  CLOTILDA HELON RIGGERS  [facility] [address]  [address] [place], [place] [address] [phone]

## 2023-04-24 ENCOUNTER — Ambulatory Visit: Payer: Self-pay | Admitting: Urology

## 2023-04-24 ENCOUNTER — Ambulatory Visit
Admission: RE | Admit: 2023-04-24 | Discharge: 2023-04-24 | Disposition: A | Discharge status: Home / Self Care | Payer: Medicare HMO | Attending: Urology | Admitting: Urology

## 2023-04-24 ENCOUNTER — Ambulatory Visit
Admission: RE | Admit: 2023-04-24 | Discharge: 2023-04-24 | Disposition: A | Discharge status: Home / Self Care | Payer: Medicare HMO | Source: Ambulatory Visit | Attending: Urology | Admitting: Urology

## 2023-04-24 ENCOUNTER — Encounter: Payer: Self-pay | Admitting: Urology

## 2023-04-24 VITALS — BP 153/89 | HR 64 | Ht 71.0 in | Wt 190.0 lb

## 2023-04-24 DIAGNOSIS — Z87442 Personal history of urinary calculi: Secondary | ICD-10-CM | POA: Diagnosis not present

## 2023-04-24 DIAGNOSIS — N200 Calculus of kidney: Secondary | ICD-10-CM | POA: Insufficient documentation

## 2023-04-24 DIAGNOSIS — N401 Enlarged prostate with lower urinary tract symptoms: Secondary | ICD-10-CM | POA: Diagnosis not present

## 2023-04-24 DIAGNOSIS — N3943 Post-void dribbling: Secondary | ICD-10-CM

## 2023-04-24 DIAGNOSIS — N138 Other obstructive and reflux uropathy: Secondary | ICD-10-CM

## 2023-04-26 ENCOUNTER — Encounter: Payer: Self-pay | Admitting: Physician Assistant

## 2023-04-26 ENCOUNTER — Ambulatory Visit (INDEPENDENT_AMBULATORY_CARE_PROVIDER_SITE_OTHER): Payer: Medicare HMO | Admitting: Physician Assistant

## 2023-04-26 VITALS — BP 134/78 | HR 62 | Temp 97.1°F | Resp 16 | Ht 71.0 in | Wt 183.0 lb

## 2023-04-26 DIAGNOSIS — R5383 Other fatigue: Secondary | ICD-10-CM

## 2023-04-26 DIAGNOSIS — I10 Essential (primary) hypertension: Secondary | ICD-10-CM | POA: Diagnosis not present

## 2023-04-26 DIAGNOSIS — G4733 Obstructive sleep apnea (adult) (pediatric): Secondary | ICD-10-CM

## 2023-04-26 DIAGNOSIS — E782 Mixed hyperlipidemia: Secondary | ICD-10-CM | POA: Diagnosis not present

## 2023-04-26 DIAGNOSIS — R7989 Other specified abnormal findings of blood chemistry: Secondary | ICD-10-CM

## 2023-04-26 NOTE — Progress Notes (Signed)
 [facility] [address] [place], [place] [address]  Internal MEDICINE  Office Visit Note  Patient Name: Michael Everett  [id number]  [id number]  Date of Service: 04/26/2023  Chief Complaint  Patient presents with   Hypertension   Hyperlipidemia   Follow-up    HPI Pt is here for routine follow up and is doing well.  -Reports no concerns today -BP stable, no dizziness -Doing well with CPAP -Will update labs for CPE -followed by urology and recently had visit earlier this week  Current Medication: Outpatient Encounter Medications as of 04/26/2023  Medication Sig   aspirin EC 81 MG tablet Take 81 mg by mouth daily.   atorvastatin  (LIPITOR) 10 MG tablet TAKE 1 TABLET (10 MG TOTAL) BY MOUTH DAILY AT 6 PM.   finasteride  (PROSCAR ) 5 MG tablet TAKE 1 TABLET BY MOUTH EVERY DAY   fluticasone  (FLONASE ) 50 MCG/ACT nasal spray INSTILL 1 SPRAY INTO BOTH NOSTRILS DAILY   lisinopril  (ZESTRIL ) 40 MG tablet TAKE 1 TABLET BY MOUTH EVERY DAY FOR BLOOD PRESSURE CONTROL   tamsulosin  (FLOMAX ) 0.4 MG CAPS capsule Take 1 capsule (0.4 mg total) by mouth daily.   No facility-administered encounter medications on file as of 04/26/2023.    Surgical History: Past Surgical History:  Procedure Laterality Date   cataract and lens implant right eye  2015   CATARACT EXTRACTION, BILATERAL Bilateral 10/2013   EXTRACORPOREAL SHOCK WAVE LITHOTRIPSY Right 10/23/2019   Procedure: EXTRACORPOREAL SHOCK WAVE LITHOTRIPSY (ESWL);  Surgeon: Twylla Glendia BROCKS, MD;  Location: [facility] ORS;  Service: Urology;  Laterality: Right;   VASECTOMY      Medical History: Past Medical History:  Diagnosis Date   Hyperlipidemia    Hypertension     Family History: Family History  Problem Relation Age of Onset   Hyperlipidemia Mother    Heart disease Father     Social History   Socioeconomic History   Marital status: Married    Spouse name: Not on file   Number of children: Not on file   Years of education:  Not on file   Highest education level: Not on file  Occupational History   Not on file  Tobacco Use   Smoking status: Former    Types: Cigarettes   Smokeless tobacco: Never  Vaping Use   Vaping status: Never Used  Substance and Sexual Activity   Alcohol use: No   Drug use: No   Sexual activity: Not on file  Other Topics Concern   Not on file  Social History Narrative   Not on file   Social Drivers of Health   Financial Resource Strain: Not on file  Food Insecurity: Not on file  Transportation Needs: Not on file  Physical Activity: Not on file  Stress: Not on file  Social Connections: Not on file  Intimate Partner Violence: Not on file      Review of Systems  Constitutional:  Negative for chills, fatigue and unexpected weight change.  HENT:  Negative for congestion, postnasal drip, rhinorrhea, sneezing and sore throat.   Eyes:  Negative for redness.  Respiratory:  Negative for cough, chest tightness and shortness of breath.   Cardiovascular:  Negative for chest pain and palpitations.  Gastrointestinal:  Negative for abdominal pain, constipation, diarrhea, nausea and vomiting.  Genitourinary:  Negative for dysuria and frequency.  Musculoskeletal:  Negative for arthralgias, back pain, joint swelling and neck pain.  Skin:  Negative for rash.  Neurological: Negative.  Negative for tremors and numbness.  Hematological:  Negative for adenopathy. Does not bruise/bleed easily.  Psychiatric/Behavioral:  Negative for behavioral problems (Depression), sleep disturbance and suicidal ideas. The patient is not nervous/anxious.     Vital Signs: BP 134/78   Pulse 62   Temp (!) 97.1 F (36.2 C)   Resp 16   Ht 5' 11 (1.803 m)   Wt 183 lb (83 kg)   SpO2 96%   BMI 25.52 kg/m    Physical Exam Vitals and nursing note reviewed.  Constitutional:      General: He is not in acute distress.    Appearance: He is well-developed and normal weight. He is not diaphoretic.  HENT:      Head: Normocephalic and atraumatic.     Right Ear: Ear canal normal.     Left Ear: Ear canal normal.     Mouth/Throat:     Pharynx: No oropharyngeal exudate.  Eyes:     Pupils: Pupils are equal, round, and reactive to light.  Neck:     Thyroid : No thyromegaly.     Vascular: No JVD.     Trachea: No tracheal deviation.  Cardiovascular:     Rate and Rhythm: Normal rate and regular rhythm.     Heart sounds: Normal heart sounds. No murmur heard.    No friction rub. No gallop.  Pulmonary:     Effort: Pulmonary effort is normal. No respiratory distress.     Breath sounds: No wheezing or rales.  Chest:     Chest wall: No tenderness.  Abdominal:     General: Bowel sounds are normal.     Palpations: Abdomen is soft.     Tenderness: There is no abdominal tenderness.  Musculoskeletal:        General: Normal range of motion.     Cervical back: Neck supple.  Skin:    General: Skin is warm and dry.  Neurological:     Mental Status: He is alert and oriented to person, place, and time.     Cranial Nerves: No cranial nerve deficit.  Psychiatric:        Behavior: Behavior normal.        Thought Content: Thought content normal.        Judgment: Judgment normal.        Assessment/Plan: 1. Primary hypertension (Primary) Stable, continue current medication  2. OSA on CPAP Continue cpap nightly  3. Mixed hyperlipidemia Continue lipitor and will update labs - Lipid Panel With LDL/HDL Ratio  4. Abnormal thyroid  blood test - TSH + free T4  5. Other fatigue - CBC w/Diff/Platelet - Comprehensive metabolic panel - TSH + free T4 - Lipid Panel With LDL/HDL Ratio   General Counseling: joangel vanosdol understanding of the findings of todays visit and agrees with plan of treatment. I have discussed any further diagnostic evaluation that may be needed or ordered today. We also reviewed his medications today. he has been encouraged to call the office with any questions or concerns that  should arise related to todays visit.    Orders Placed This Encounter  Procedures   CBC w/Diff/Platelet   Comprehensive metabolic panel   TSH + free T4   Lipid Panel With LDL/HDL Ratio    No orders of the defined types were placed in this encounter.   This patient was seen by Tinnie Pro, PA-C in collaboration with Dr. Sigrid Bathe as a part of collaborative care agreement.   Total time spent:30 Minutes Time spent includes review of chart, medications, test  results, and follow up plan with the patient.      Dr Fozia M Khan Internal medicine

## 2023-08-21 ENCOUNTER — Ambulatory Visit: Payer: Medicare HMO | Admitting: Urology

## 2023-08-27 ENCOUNTER — Encounter: Payer: Self-pay | Admitting: Nurse Practitioner

## 2023-08-27 ENCOUNTER — Ambulatory Visit: Payer: Medicare HMO | Admitting: Nurse Practitioner

## 2023-08-27 ENCOUNTER — Telehealth: Payer: Self-pay | Admitting: Nurse Practitioner

## 2023-08-27 VITALS — BP 136/82 | HR 67 | Temp 98.1°F | Resp 16 | Ht 71.0 in | Wt 168.8 lb

## 2023-08-27 DIAGNOSIS — I10 Essential (primary) hypertension: Secondary | ICD-10-CM | POA: Diagnosis not present

## 2023-08-27 DIAGNOSIS — Z7189 Other specified counseling: Secondary | ICD-10-CM | POA: Diagnosis not present

## 2023-08-27 DIAGNOSIS — G4733 Obstructive sleep apnea (adult) (pediatric): Secondary | ICD-10-CM

## 2023-08-27 NOTE — Progress Notes (Signed)
 [facility] [address] [place], [place] [address]  Internal MEDICINE  Office Visit Note  Patient Name: Michael Everett  [id number]  [id number]  Date of Service: 08/27/2023  Chief Complaint  Patient presents with   Follow-up    HPI [name] presents for a follow-up visit for OSA and CPAP use.   patient reports that his cpap machine might be 20 or 80 years old, we can order a new one and see if his insurance will approve. No other issues. Gets routine deliveries of supplies from [facility].   CPAP download: Min pressure 5 cmH2O Max pressure 15 cmH2O 95 percentile pressure 13 cmH2O 95th percentile leak 0.2 L/min apnea index 0.5 events/hr Hypopnea 0.3 events/hr apnea-hypopnea index  0.8 events/hr Average time used per night 8 hours 55 minutes. total days used  >4 hr 30 days total days used <4 hr 0 days  Total compliance 100 percent     EPWORTH SLEEPINESS SCALE: Scale: (0)= no chance of dozing; (1)= slight chance of dozing; (2)= moderate chance of dozing; (3)= high chance of dozing Chance  Situtation Sitting and reading: 0 Watching TV: 1   Sitting Inactive in public: 0 As a passenger in car: 0   Lying down to rest: 1 Sitting and talking: 0 Sitting quielty after lunch: 0 In a car, stopped in traffic: 0 TOTAL SCORE:   2 out of 24    Current Medication: Outpatient Encounter Medications as of 08/27/2023  Medication Sig   aspirin EC 81 MG tablet Take 81 mg by mouth daily.   atorvastatin  (LIPITOR) 10 MG tablet TAKE 1 TABLET (10 MG TOTAL) BY MOUTH DAILY AT 6 PM.   finasteride  (PROSCAR ) 5 MG tablet TAKE 1 TABLET BY MOUTH EVERY DAY   fluticasone  (FLONASE ) 50 MCG/ACT nasal spray INSTILL 1 SPRAY INTO BOTH NOSTRILS DAILY   lisinopril  (ZESTRIL ) 40 MG tablet TAKE 1 TABLET BY MOUTH EVERY DAY FOR BLOOD PRESSURE CONTROL   tamsulosin  (FLOMAX ) 0.4 MG CAPS capsule Take 1 capsule (0.4 mg total) by mouth daily.   No facility-administered encounter medications on file as of 08/27/2023.     Surgical History: Past Surgical History:  Procedure Laterality Date   cataract and lens implant right eye  2015   CATARACT EXTRACTION, BILATERAL Bilateral 10/2013   EXTRACORPOREAL SHOCK WAVE LITHOTRIPSY Right 10/23/2019   Procedure: EXTRACORPOREAL SHOCK WAVE LITHOTRIPSY (ESWL);  Surgeon: Geraline Knapp, MD;  Location: [facility] ORS;  Service: Urology;  Laterality: Right;   VASECTOMY      Medical History: Past Medical History:  Diagnosis Date   Hyperlipidemia    Hypertension     Family History: Family History  Problem Relation Age of Onset   Hyperlipidemia Mother    Heart disease Father     Social History   Socioeconomic History   Marital status: Married    Spouse name: Not on file   Number of children: Not on file   Years of education: Not on file   Highest education level: Not on file  Occupational History   Not on file  Tobacco Use   Smoking status: Former    Types: Cigarettes   Smokeless tobacco: Never  Vaping Use   Vaping status: Never Used  Substance and Sexual Activity   Alcohol use: No   Drug use: No   Sexual activity: Not on file  Other Topics Concern   Not on file  Social History Narrative   Not on file   Social Drivers of Health  Financial Resource Strain: Not on file  Food Insecurity: Not on file  Transportation Needs: Not on file  Physical Activity: Not on file  Stress: Not on file  Social Connections: Not on file  Intimate Partner Violence: Not on file      Review of Systems  Constitutional:  Negative for chills, fatigue and unexpected weight change.  HENT:  Negative for congestion, postnasal drip, rhinorrhea, sneezing and sore throat.   Eyes:  Negative for redness.  Respiratory:  Negative for cough, chest tightness and shortness of breath.   Cardiovascular:  Negative for chest pain and palpitations.  Gastrointestinal:  Negative for abdominal pain, constipation, diarrhea, nausea and vomiting.  Genitourinary:  Negative for dysuria and  frequency.  Musculoskeletal:  Negative for arthralgias, back pain, joint swelling and neck pain.  Skin:  Negative for rash.  Neurological: Negative.  Negative for tremors and numbness.  Hematological:  Negative for adenopathy. Does not bruise/bleed easily.  Psychiatric/Behavioral:  Negative for behavioral problems (Depression), sleep disturbance and suicidal ideas. The patient is not nervous/anxious.     Vital Signs: BP 136/82   Pulse 67   Temp 98.1 F (36.7 C)   Resp 16   Ht 5\' 11"  (1.803 m)   Wt 168 lb 12.8 oz (76.6 kg)   SpO2 97%   BMI 23.54 kg/m    Physical Exam Vitals and nursing note reviewed.  Constitutional:      General: He is not in acute distress.    Appearance: He is well-developed and normal weight. He is not diaphoretic.  HENT:     Head: Normocephalic and atraumatic.     Right Ear: Ear canal normal.     Left Ear: Ear canal normal.     Mouth/Throat:     Pharynx: No oropharyngeal exudate.  Eyes:     Pupils: Pupils are equal, round, and reactive to light.  Neck:     Thyroid : No thyromegaly.     Vascular: No JVD.     Trachea: No tracheal deviation.  Cardiovascular:     Rate and Rhythm: Normal rate and regular rhythm.     Heart sounds: Normal heart sounds. No murmur heard.    No friction rub. No gallop.  Pulmonary:     Effort: Pulmonary effort is normal. No respiratory distress.     Breath sounds: No wheezing or rales.  Chest:     Chest wall: No tenderness.  Abdominal:     General: Bowel sounds are normal.     Palpations: Abdomen is soft.  Musculoskeletal:        General: Normal range of motion.     Cervical back: Neck supple.  Skin:    General: Skin is warm and dry.  Neurological:     Mental Status: He is alert and oriented to person, place, and time.     Cranial Nerves: No cranial nerve deficit.  Psychiatric:        Behavior: Behavior normal.        Thought Content: Thought content normal.        Judgment: Judgment normal.         Assessment/Plan: 1. OSA on CPAP (Primary) New CPAP ordered. Continue CPAP use as instructed  - For home use only DME continuous positive airway pressure (CPAP)  2. CPAP use counseling New CPAP ordered. Continue CPAP use as instructed  - For home use only DME continuous positive airway pressure (CPAP)  3. Primary hypertension Stable, continue medications as prescribed.    General  Counseling: cypress fanfan understanding of the findings of todays visit and agrees with plan of treatment. I have discussed any further diagnostic evaluation that may be needed or ordered today. We also reviewed his medications today. he has been encouraged to call the office with any questions or concerns that should arise related to todays visit.    Orders Placed This Encounter  Procedures   For home use only DME continuous positive airway pressure (CPAP)    No orders of the defined types were placed in this encounter.   Return in about 6 months (around 02/26/2024) for F/U, pulmonary/sleep, Davius Goudeau or dsk, will also need a follow up after starting to use the new cpap.   Total time spent:30 Minutes Time spent includes review of chart, medications, test results, and follow up plan with the patient.   [place] Controlled Substance Database was reviewed by me.  This patient was seen by Laurence Pons, FNP-C in collaboration with Dr. Verneta Gone as a part of collaborative care agreement.   Brycen Bean R. Bobbi Burow, MSN, FNP-C Internal medicine

## 2023-08-27 NOTE — Telephone Encounter (Signed)
 Notified Beth & Sarah w/ [facility] of cpap replacement & supply order-Toni

## 2023-09-06 ENCOUNTER — Telehealth: Payer: Self-pay | Admitting: Nurse Practitioner

## 2023-09-06 NOTE — Telephone Encounter (Signed)
 Per Michael Everett w/ [facility]; he is not eligible for machine until 12/11/2023 he is not eligible for supplies either he gets them shipped to him supplies 09/16/2023, humidifier chamber 12/17/2023, headgear 12/15/2023.-Toni

## 2023-09-27 LAB — CBC WITH DIFFERENTIAL/PLATELET
Basophils Absolute: 0 x10E3/uL (ref 0.0–0.2)
Basos: 0 %
EOS (ABSOLUTE): 0.3 x10E3/uL (ref 0.0–0.4)
Eos: 4 %
Hematocrit: 47.4 % (ref 37.5–51.0)
Hemoglobin: 15.4 g/dL (ref 13.0–17.7)
Immature Grans (Abs): 0 x10E3/uL (ref 0.0–0.1)
Immature Granulocytes: 0 %
Lymphocytes Absolute: 1.4 x10E3/uL (ref 0.7–3.1)
Lymphs: 20 %
MCH: 30.3 pg (ref 26.6–33.0)
MCHC: 32.5 g/dL (ref 31.5–35.7)
MCV: 93 fL (ref 79–97)
Monocytes Absolute: 0.5 x10E3/uL (ref 0.1–0.9)
Monocytes: 8 %
Neutrophils Absolute: 4.8 x10E3/uL (ref 1.4–7.0)
Neutrophils: 68 %
Platelets: 143 x10E3/uL — ABNORMAL LOW (ref 150–450)
RBC: 5.08 x10E6/uL (ref 4.14–5.80)
RDW: 13.4 % (ref 11.6–15.4)
WBC: 7.1 x10E3/uL (ref 3.4–10.8)

## 2023-09-27 LAB — LIPID PANEL WITH LDL/HDL RATIO
Cholesterol, Total: 103 mg/dL (ref 100–199)
HDL: 41 mg/dL (ref >39)
LDL Chol Calc (NIH): 47 mg/dL (ref 0–99)
LDL/HDL Ratio: 1.1 ratio (ref 0.0–3.6)
Triglycerides: 74 mg/dL (ref 0–149)
VLDL Cholesterol Cal: 15 mg/dL (ref 5–40)

## 2023-09-27 LAB — COMPREHENSIVE METABOLIC PANEL WITH GFR
ALT: 18 IU/L (ref 0–44)
AST: 17 IU/L (ref 0–40)
Albumin: 4.2 g/dL (ref 3.8–4.8)
Alkaline Phosphatase: 79 IU/L (ref 44–121)
BUN/Creatinine Ratio: 20 (ref 10–24)
BUN: 20 mg/dL (ref 8–27)
Bilirubin Total: 1 mg/dL (ref 0.0–1.2)
CO2: 22 mmol/L (ref 20–29)
Calcium: 9.5 mg/dL (ref 8.6–10.2)
Chloride: 102 mmol/L (ref 96–106)
Creatinine, Ser: 0.99 mg/dL (ref 0.76–1.27)
Globulin, Total: 2.5 g/dL (ref 1.5–4.5)
Glucose: 89 mg/dL (ref 70–99)
Potassium: 4.5 mmol/L (ref 3.5–5.2)
Sodium: 140 mmol/L (ref 134–144)
Total Protein: 6.7 g/dL (ref 6.0–8.5)
eGFR: 77 mL/min/1.73 (ref >59)

## 2023-09-27 LAB — TSH+FREE T4
Free T4: 1.16 ng/dL (ref 0.82–1.77)
TSH: 1.76 u[IU]/mL (ref 0.450–4.500)

## 2023-10-03 ENCOUNTER — Ambulatory Visit: Payer: Self-pay | Admitting: Physician Assistant

## 2023-11-01 ENCOUNTER — Ambulatory Visit (INDEPENDENT_AMBULATORY_CARE_PROVIDER_SITE_OTHER): Payer: Medicare HMO | Admitting: Physician Assistant

## 2023-11-01 ENCOUNTER — Encounter: Payer: Self-pay | Admitting: Physician Assistant

## 2023-11-01 VITALS — BP 128/70 | HR 52 | Temp 98.0°F | Resp 16 | Ht 71.0 in | Wt 163.0 lb

## 2023-11-01 DIAGNOSIS — I5189 Other ill-defined heart diseases: Secondary | ICD-10-CM

## 2023-11-01 DIAGNOSIS — I10 Essential (primary) hypertension: Secondary | ICD-10-CM | POA: Diagnosis not present

## 2023-11-01 DIAGNOSIS — E782 Mixed hyperlipidemia: Secondary | ICD-10-CM

## 2023-11-01 DIAGNOSIS — R0789 Other chest pain: Secondary | ICD-10-CM

## 2023-11-01 DIAGNOSIS — R001 Bradycardia, unspecified: Secondary | ICD-10-CM | POA: Diagnosis not present

## 2023-11-01 DIAGNOSIS — Z0000 Encounter for general adult medical examination without abnormal findings: Secondary | ICD-10-CM | POA: Diagnosis not present

## 2023-11-01 MED ORDER — FLUTICASONE PROPIONATE 50 MCG/ACT NA SUSP: 1.0000 | Freq: Every day | NASAL | 3 refills | Status: AC

## 2023-11-01 NOTE — Progress Notes (Signed)
 [facility] [address] [place], [place] [address]  Internal MEDICINE  Office Visit Note  Patient Name: Michael Everett  [id number]  [id number]  Date of Service: 11/01/2023  Chief Complaint  Patient presents with   Medicare Wellness   Hypertension   Hyperlipidemia    HPI Michael Everett presents for an annual well visit Well-appearing 78 y.o. male Labs: Reviewed, platelets borderline low, but otherwise look good. Has hx of platelets borderline -Sleeping well -Some exercise, some walking -good appetite -felt a small bump along end of sternum, not really painful but when he presses on it feels almost like pressure in chest and wondered about his heart. No CP or SOB. Just knows that its there. Discussed possible soft tissue US  of the area vs echo and will move forward with echo at this time. Does have hx of mild MR, TR, AR and diastolic dysfunction. Advised to call or go to ED if new or worsening symptoms arise     11/01/2023    9:23 AM 10/27/2022    9:19 AM 10/14/2021   10:00 AM  MMSE - Mini Mental State Exam  Orientation to time 5 5 5   Orientation to Place 5 5 5   Registration 3 3 3   Attention/ Calculation 5 5 5   Recall 3 3 3   Language- name 2 objects 2 2 2   Language- repeat 1 1 1   Language- follow 3 step command 3 3 3   Language- read & follow direction 1 1 1   Write a sentence 1 1 1   Copy design 1 1 1   Total score 30 30 30     Functional Status Survey: Is the patient deaf or have difficulty hearing?: No Does the patient have difficulty seeing, even when wearing glasses/contacts?: No Does the patient have difficulty concentrating, remembering, or making decisions?: No Does the patient have difficulty walking or climbing stairs?: No Does the patient have difficulty dressing or bathing?: No Does the patient have difficulty doing errands alone such as visiting a doctor's office or shopping?: No     10/14/2021    9:59 AM 06/05/2022    8:33 AM 10/27/2022    9:20 AM  04/26/2023    8:27 AM 11/01/2023    9:23 AM  Fall Risk  Falls in the past year? 0 0 0 0 0  Was there an injury with Fall?    0   Fall Risk Category Calculator    0   Patient at Risk for Falls Due to  No Fall Risks No Fall Risks No Fall Risks   Fall risk Follow up  Falls evaluation completed Falls evaluation completed Falls evaluation completed        11/01/2023    9:23 AM  Depression screen PHQ 2/9  Decreased Interest 0  Down, Depressed, Hopeless 0  PHQ - 2 Score 0        No data to display            Current Medication: Outpatient Encounter Medications as of 11/01/2023  Medication Sig   aspirin EC 81 MG tablet Take 81 mg by mouth daily.   atorvastatin  (LIPITOR) 10 MG tablet TAKE 1 TABLET (10 MG TOTAL) BY MOUTH DAILY AT 6 PM.   finasteride  (PROSCAR ) 5 MG tablet TAKE 1 TABLET BY MOUTH EVERY DAY   lisinopril  (ZESTRIL ) 40 MG tablet TAKE 1 TABLET BY MOUTH EVERY DAY FOR BLOOD PRESSURE CONTROL   tamsulosin  (FLOMAX ) 0.4 MG CAPS capsule Take 1 capsule (0.4 mg total) by mouth daily.   [  DISCONTINUED] fluticasone  (FLONASE ) 50 MCG/ACT nasal spray INSTILL 1 SPRAY INTO BOTH NOSTRILS DAILY   fluticasone  (FLONASE ) 50 MCG/ACT nasal spray Place 1 spray into both nostrils daily.   No facility-administered encounter medications on file as of 11/01/2023.    Surgical History: Past Surgical History:  Procedure Laterality Date   cataract and lens implant right eye  2015   CATARACT EXTRACTION, BILATERAL Bilateral 10/2013   EXTRACORPOREAL SHOCK WAVE LITHOTRIPSY Right 10/23/2019   Procedure: EXTRACORPOREAL SHOCK WAVE LITHOTRIPSY (ESWL);  Surgeon: Twylla Glendia BROCKS, MD;  Location: [facility] ORS;  Service: Urology;  Laterality: Right;   VASECTOMY      Medical History: Past Medical History:  Diagnosis Date   Hyperlipidemia    Hypertension     Family History: Family History  Problem Relation Age of Onset   Hyperlipidemia Mother    Heart disease Father     Social History   Socioeconomic  History   Marital status: Widowed    Spouse name: Not on file   Number of children: Not on file   Years of education: Not on file   Highest education level: Not on file  Occupational History   Not on file  Tobacco Use   Smoking status: Former    Types: Cigarettes   Smokeless tobacco: Never  Vaping Use   Vaping status: Never Used  Substance and Sexual Activity   Alcohol use: No   Drug use: No   Sexual activity: Not on file  Other Topics Concern   Not on file  Social History Narrative   Not on file   Social Drivers of Health   Financial Resource Strain: Not on file  Food Insecurity: Not on file  Transportation Needs: Not on file  Physical Activity: Not on file  Stress: Not on file  Social Connections: Not on file  Intimate Partner Violence: Not on file      Review of Systems  Constitutional:  Negative for chills, fatigue and unexpected weight change.  HENT:  Negative for congestion, postnasal drip, rhinorrhea, sneezing and sore throat.   Eyes:  Negative for redness.  Respiratory:  Negative for cough, chest tightness and shortness of breath.   Cardiovascular:  Negative for chest pain and palpitations.  Gastrointestinal:  Negative for abdominal pain, constipation, diarrhea, nausea and vomiting.  Genitourinary:  Negative for dysuria and frequency.  Musculoskeletal:  Negative for arthralgias, back pain, joint swelling and neck pain.  Skin:  Negative for rash.       Feels bump along center chest along sternum with deep pressure  Neurological: Negative.  Negative for tremors and numbness.  Hematological:  Negative for adenopathy. Does not bruise/bleed easily.  Psychiatric/Behavioral:  Negative for behavioral problems (Depression), sleep disturbance and suicidal ideas. The patient is not nervous/anxious.     Vital Signs: BP 128/70   Pulse (!) 52   Temp 98 F (36.7 C)   Resp 16   Ht 5' 11 (1.803 m)   Wt 163 lb (73.9 kg)   SpO2 99%   BMI 22.73 kg/m    Physical  Exam Vitals and nursing note reviewed.  Constitutional:      General: He is not in acute distress.    Appearance: He is well-developed and normal weight. He is not diaphoretic.  HENT:     Head: Normocephalic and atraumatic.     Right Ear: Ear canal normal.     Left Ear: Ear canal normal.     Mouth/Throat:     Pharynx: No oropharyngeal  exudate.  Eyes:     Pupils: Pupils are equal, round, and reactive to light.  Neck:     Thyroid : No thyromegaly.     Vascular: No JVD.     Trachea: No tracheal deviation.  Cardiovascular:     Rate and Rhythm: Normal rate and regular rhythm.     Heart sounds: Normal heart sounds. No murmur heard.    No friction rub. No gallop.  Pulmonary:     Effort: Pulmonary effort is normal. No respiratory distress.     Breath sounds: No wheezing or rales.  Chest:     Chest wall: No tenderness.  Musculoskeletal:        General: No tenderness. Normal range of motion.     Cervical back: Neck supple.     Comments: Possible increased prominence along xyphoid process, non tender, no skin changes  Skin:    General: Skin is warm and dry.  Neurological:     Mental Status: He is alert and oriented to person, place, and time.  Psychiatric:        Behavior: Behavior normal.        Thought Content: Thought content normal.        Judgment: Judgment normal.        Assessment/Plan: 1. Encounter for annual wellness exam in Medicare patient (Primary) AWV performed, labs reviewed  2. Diastolic dysfunction Will update echo - ECHOCARDIOGRAM COMPLETE; Future  3. Bradycardia Stable and asymptomatic, will update echo - ECHOCARDIOGRAM COMPLETE; Future  4. Chest wall discomfort Will update echo, but may need further diagnostics of area of concern pending results. Advised to call or go to ED if new or worsening symptoms. May consider CT if needed - ECHOCARDIOGRAM COMPLETE; Future  5. Primary hypertension Stable, continue current medication  6. Mixed  hyperlipidemia Continue lipitor    General Counseling: ciaran begay understanding of the findings of todays visit and agrees with plan of treatment. I have discussed any further diagnostic evaluation that may be needed or ordered today. We also reviewed his medications today. he has been encouraged to call the office with any questions or concerns that should arise related to todays visit.    Orders Placed This Encounter  Procedures   ECHOCARDIOGRAM COMPLETE    Meds ordered this encounter  Medications   fluticasone  (FLONASE ) 50 MCG/ACT nasal spray    Sig: Place 1 spray into both nostrils daily.    Dispense:  48 mL    Refill:  3    Return for after test is done.   Total time spent:35 Minutes Time spent includes review of chart, medications, test results, and follow up plan with the patient.    Controlled Substance Database was reviewed by me.  This patient was seen by Tinnie Pro, PA-C in collaboration with Dr. Sigrid Bathe as a part of collaborative care agreement.  Tinnie Pro, PA-C Internal medicine

## 2023-11-05 ENCOUNTER — Telehealth: Payer: Self-pay | Admitting: Physician Assistant

## 2023-11-05 NOTE — Telephone Encounter (Signed)
 Lvm for patient to return call for echo appointment and to schedule follow up-Toni

## 2023-11-28 ENCOUNTER — Other Ambulatory Visit: Payer: Self-pay | Admitting: Urology

## 2023-11-28 DIAGNOSIS — N138 Other obstructive and reflux uropathy: Secondary | ICD-10-CM

## 2023-12-26 ENCOUNTER — Telehealth: Payer: Self-pay | Admitting: Internal Medicine

## 2023-12-26 NOTE — Telephone Encounter (Signed)
 Lvm & sent mychart msg to move 03/03/24 appointment to -Andree

## 2023-12-31 ENCOUNTER — Other Ambulatory Visit: Payer: Self-pay | Admitting: Physician Assistant

## 2024-01-02 ENCOUNTER — Ambulatory Visit
Admission: RE | Admit: 2024-01-02 | Discharge: 2024-01-02 | Disposition: A | Discharge status: Home / Self Care | Source: Ambulatory Visit | Attending: Physician Assistant | Admitting: Physician Assistant

## 2024-01-02 DIAGNOSIS — R001 Bradycardia, unspecified: Secondary | ICD-10-CM | POA: Insufficient documentation

## 2024-01-02 DIAGNOSIS — G4730 Sleep apnea, unspecified: Secondary | ICD-10-CM | POA: Diagnosis not present

## 2024-01-02 DIAGNOSIS — R0789 Other chest pain: Secondary | ICD-10-CM | POA: Diagnosis not present

## 2024-01-02 DIAGNOSIS — I080 Rheumatic disorders of both mitral and aortic valves: Secondary | ICD-10-CM | POA: Diagnosis not present

## 2024-01-02 DIAGNOSIS — I5189 Other ill-defined heart diseases: Secondary | ICD-10-CM | POA: Insufficient documentation

## 2024-01-02 DIAGNOSIS — E785 Hyperlipidemia, unspecified: Secondary | ICD-10-CM | POA: Insufficient documentation

## 2024-01-02 DIAGNOSIS — I119 Hypertensive heart disease without heart failure: Secondary | ICD-10-CM | POA: Diagnosis not present

## 2024-01-02 LAB — ECHOCARDIOGRAM COMPLETE
AR max vel: 2.96 cm2
AV Peak grad: 5.9 mmHg
Ao pk vel: 1.22 m/s
Area-P 1/2: 3.85 cm2
MV M vel: 2.19 m/s
MV Peak grad: 19.2 mmHg
P 1/2 time: 617 ms
S' Lateral: 2.5 cm

## 2024-01-02 NOTE — Progress Notes (Signed)
 Echocardiogram 2D Echocardiogram has been performed.  Michael Everett 01/02/2024, 11:39 AM

## 2024-01-04 ENCOUNTER — Other Ambulatory Visit: Payer: Self-pay | Admitting: Physician Assistant

## 2024-02-04 ENCOUNTER — Encounter: Payer: Self-pay | Admitting: Physician Assistant

## 2024-02-04 ENCOUNTER — Ambulatory Visit (INDEPENDENT_AMBULATORY_CARE_PROVIDER_SITE_OTHER): Admitting: Physician Assistant

## 2024-02-04 VITALS — BP 130/80 | HR 49 | Temp 97.8°F | Resp 16 | Ht 71.0 in | Wt 159.0 lb

## 2024-02-04 DIAGNOSIS — I340 Nonrheumatic mitral (valve) insufficiency: Secondary | ICD-10-CM

## 2024-02-04 DIAGNOSIS — R001 Bradycardia, unspecified: Secondary | ICD-10-CM | POA: Diagnosis not present

## 2024-02-04 DIAGNOSIS — R0789 Other chest pain: Secondary | ICD-10-CM | POA: Diagnosis not present

## 2024-02-04 DIAGNOSIS — I5189 Other ill-defined heart diseases: Secondary | ICD-10-CM | POA: Diagnosis not present

## 2024-02-04 DIAGNOSIS — M898X8 Other specified disorders of bone, other site: Secondary | ICD-10-CM | POA: Diagnosis not present

## 2024-02-04 NOTE — Progress Notes (Signed)
 [facility] [address] [place], [place] [address]  Internal MEDICINE  Office Visit Note  Patient Name: Michael Everett  [id number]  [id number]  Date of Service: 02/04/2024  Chief Complaint  Patient presents with   Follow-up    echo    HPI Pt is here for routine follow up -Echo reviewed and mostly unchanged from prior in 2020.  Echo Impressions 01/02/24:  . Left ventricular ejection fraction, by estimation, is 55 to 60%. The left ventricle has normal function. The left ventricle has no regional wall motion abnormalities. Left ventricular diastolic parameters are consistent with Grade I diastolic  dysfunction (impaired relaxation).   2. Right ventricular systolic function is normal. The right ventricular size is normal.   3. The mitral valve is normal in structure. Mild mitral valve regurgitation. No evidence of mitral stenosis.   4. The aortic valve is tricuspid. There is mild calcification of the aortic valve. Aortic valve regurgitation is mild. Aortic valve sclerosis is present, with no evidence of aortic valve stenosis.   5. The inferior vena cava is normal in size with greater than 50% respiratory variability, suggesting right atrial pressure of 3 mmHg.   -No SOB, CP, palpitations, or ankle swelling -still has small bump along bottom edge of sternum. If he presses hard can be uncomfortable, but otherwise does not bother him. Present for a few months now without change. No skin changes.  Current Medication: Outpatient Encounter Medications as of 02/04/2024  Medication Sig   aspirin EC 81 MG tablet Take 81 mg by mouth daily.   atorvastatin  (LIPITOR) 10 MG tablet TAKE 1 TABLET (10 MG TOTAL) BY MOUTH DAILY AT 6 PM.   finasteride  (PROSCAR ) 5 MG tablet TAKE 1 TABLET BY MOUTH EVERY DAY   fluticasone  (FLONASE ) 50 MCG/ACT nasal spray Place 1 spray into both nostrils daily.   lisinopril  (ZESTRIL ) 40 MG tablet TAKE 1 TABLET BY MOUTH EVERY DAY FOR BLOOD PRESSURE CONTROL    tamsulosin  (FLOMAX ) 0.4 MG CAPS capsule TAKE 1 CAPSULE BY MOUTH EVERY DAY   No facility-administered encounter medications on file as of 02/04/2024.    Surgical History: Past Surgical History:  Procedure Laterality Date   cataract and lens implant right eye  2015   CATARACT EXTRACTION, BILATERAL Bilateral 10/2013   EXTRACORPOREAL SHOCK WAVE LITHOTRIPSY Right 10/23/2019   Procedure: EXTRACORPOREAL SHOCK WAVE LITHOTRIPSY (ESWL);  Surgeon: Twylla Glendia BROCKS, MD;  Location: [facility] ORS;  Service: Urology;  Laterality: Right;   VASECTOMY      Medical History: Past Medical History:  Diagnosis Date   Hyperlipidemia    Hypertension     Family History: Family History  Problem Relation Age of Onset   Hyperlipidemia Mother    Heart disease Father     Social History   Socioeconomic History   Marital status: Widowed    Spouse name: Not on file   Number of children: Not on file   Years of education: Not on file   Highest education level: Not on file  Occupational History   Not on file  Tobacco Use   Smoking status: Former    Types: Cigarettes   Smokeless tobacco: Never  Vaping Use   Vaping status: Never Used  Substance and Sexual Activity   Alcohol use: No   Drug use: No   Sexual activity: Not on file  Other Topics Concern   Not on file  Social History Narrative   Not on file   Social Drivers of Health  Financial Resource Strain: Not on file  Food Insecurity: Not on file  Transportation Needs: Not on file  Physical Activity: Not on file  Stress: Not on file  Social Connections: Not on file  Intimate Partner Violence: Not on file      Review of Systems  Constitutional:  Negative for chills, fatigue and unexpected weight change.  HENT:  Negative for congestion, postnasal drip, rhinorrhea, sneezing and sore throat.   Eyes:  Negative for redness.  Respiratory:  Negative for cough, chest tightness and shortness of breath.   Cardiovascular:  Negative for chest pain  and palpitations.  Gastrointestinal:  Negative for abdominal pain, constipation, diarrhea, nausea and vomiting.  Genitourinary:  Negative for dysuria and frequency.  Musculoskeletal:  Negative for arthralgias, back pain, joint swelling and neck pain.  Skin:  Negative for rash.       Feels bump along center chest along sternum with deep pressure  Neurological: Negative.  Negative for tremors and numbness.  Hematological:  Negative for adenopathy. Does not bruise/bleed easily.  Psychiatric/Behavioral:  Negative for behavioral problems (Depression), sleep disturbance and suicidal ideas. The patient is not nervous/anxious.     Vital Signs: BP 130/80   Pulse (!) 49   Temp 97.8 F (36.6 C)   Resp 16   Ht 5' 11 (1.803 m)   Wt 159 lb (72.1 kg)   SpO2 99%   BMI 22.18 kg/m    Physical Exam Vitals and nursing note reviewed.  Constitutional:      General: He is not in acute distress.    Appearance: He is well-developed and normal weight. He is not diaphoretic.  HENT:     Head: Normocephalic and atraumatic.     Right Ear: Ear canal normal.     Left Ear: Ear canal normal.     Mouth/Throat:     Pharynx: No oropharyngeal exudate.  Eyes:     Pupils: Pupils are equal, round, and reactive to light.  Neck:     Thyroid : No thyromegaly.     Vascular: No JVD.     Trachea: No tracheal deviation.  Cardiovascular:     Rate and Rhythm: Normal rate and regular rhythm.     Heart sounds: Normal heart sounds. No murmur heard.    No friction rub. No gallop.  Pulmonary:     Effort: Pulmonary effort is normal. No respiratory distress.     Breath sounds: No wheezing or rales.  Chest:     Chest wall: No tenderness.  Musculoskeletal:        General: No tenderness. Normal range of motion.     Cervical back: Neck supple.     Comments: Possible increased prominence along left edge xyphoid process, non tender, no skin changes  Skin:    General: Skin is warm and dry.  Neurological:     Mental Status:  He is alert and oriented to person, place, and time.  Psychiatric:        Behavior: Behavior normal.        Thought Content: Thought content normal.        Judgment: Judgment normal.        Assessment/Plan: 1. Chest wall discomfort (Primary) Will order soft issue US  for further evaluation - US  CHEST SOFT TISSUE; Future  2. Mass of sternum Will order soft issue US  for further evaluation - US  CHEST SOFT TISSUE; Future  3. Diastolic dysfunction Stable on recent echo from prior  4. Bradycardia Asymptomatic and echo stable, continue to  monitor  5. Mild mitral regurgitation Stable on repeat echo, monitor   General Counseling: nery frappier understanding of the findings of todays visit and agrees with plan of treatment. I have discussed any further diagnostic evaluation that may be needed or ordered today. We also reviewed his medications today. he has been encouraged to call the office with any questions or concerns that should arise related to todays visit.    Orders Placed This Encounter  Procedures   US  CHEST SOFT TISSUE    No orders of the defined types were placed in this encounter.   This patient was seen by Tinnie Pro, PA-C in collaboration with Dr. Sigrid Bathe as a part of collaborative care agreement.   Total time spent:30 Minutes Time spent includes review of chart, medications, test results, and follow up plan with the patient.      Dr Fozia M Khan Internal medicine

## 2024-02-05 ENCOUNTER — Telehealth: Payer: Self-pay | Admitting: Physician Assistant

## 2024-02-05 NOTE — Telephone Encounter (Signed)
 Lvm notifying patient of U/S appointment date, arrival time, location-Toni

## 2024-02-18 ENCOUNTER — Ambulatory Visit
Admission: RE | Admit: 2024-02-18 | Discharge: 2024-02-18 | Disposition: A | Source: Ambulatory Visit | Attending: Physician Assistant

## 2024-02-18 DIAGNOSIS — R0789 Other chest pain: Secondary | ICD-10-CM | POA: Insufficient documentation

## 2024-02-18 DIAGNOSIS — M898X8 Other specified disorders of bone, other site: Secondary | ICD-10-CM | POA: Insufficient documentation

## 2024-02-25 ENCOUNTER — Ambulatory Visit: Admitting: Nurse Practitioner

## 2024-02-27 ENCOUNTER — Ambulatory Visit: Payer: Self-pay | Admitting: Physician Assistant

## 2024-03-03 ENCOUNTER — Encounter: Payer: Self-pay | Admitting: Physician Assistant

## 2024-03-03 ENCOUNTER — Ambulatory Visit: Admitting: Internal Medicine

## 2024-03-03 ENCOUNTER — Ambulatory Visit (INDEPENDENT_AMBULATORY_CARE_PROVIDER_SITE_OTHER): Admitting: Physician Assistant

## 2024-03-03 VITALS — BP 110/78 | HR 68 | Temp 98.0°F | Resp 16 | Ht 71.0 in | Wt 160.4 lb

## 2024-03-03 DIAGNOSIS — R29898 Other symptoms and signs involving the musculoskeletal system: Secondary | ICD-10-CM

## 2024-03-03 DIAGNOSIS — I10 Essential (primary) hypertension: Secondary | ICD-10-CM

## 2024-03-03 NOTE — Progress Notes (Signed)
 [facility] [address] [place], [place] [address]  Internal MEDICINE  Office Visit Note  Patient Name: Michael Everett  [id number]  [id number]  Date of Service: 03/03/2024  Chief Complaint  Patient presents with   Follow-up    Review chest ultrasound    HPI Pt is here for routine follow up to review US  -Soft tissue US  of chest done due to palpable area along left base of sternum that he found to have some tenderness with deep palpation -US  chest soft tissue: IMPRESSION: 1. 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 -This finding is not listed on prior CT findings that I can see, but per US  reports this was previously seen and correlates with area of concern -BP well controlled  Current Medication: Outpatient Encounter Medications as of 03/03/2024  Medication Sig   aspirin EC 81 MG tablet Take 81 mg by mouth daily.   atorvastatin  (LIPITOR) 10 MG tablet TAKE 1 TABLET (10 MG TOTAL) BY MOUTH DAILY AT 6 PM.   finasteride  (PROSCAR ) 5 MG tablet TAKE 1 TABLET BY MOUTH EVERY DAY   fluticasone  (FLONASE ) 50 MCG/ACT nasal spray Place 1 spray into both nostrils daily.   lisinopril  (ZESTRIL ) 40 MG tablet TAKE 1 TABLET BY MOUTH EVERY DAY FOR BLOOD PRESSURE CONTROL   tamsulosin  (FLOMAX ) 0.4 MG CAPS capsule TAKE 1 CAPSULE BY MOUTH EVERY DAY   No facility-administered encounter medications on file as of 03/03/2024.    Surgical History: Past Surgical History:  Procedure Laterality Date   cataract and lens implant right eye  2015   CATARACT EXTRACTION, BILATERAL Bilateral 10/2013   EXTRACORPOREAL SHOCK WAVE LITHOTRIPSY Right 10/23/2019   Procedure: EXTRACORPOREAL SHOCK WAVE LITHOTRIPSY (ESWL);  Surgeon: Twylla Glendia BROCKS, MD;  Location: [facility] ORS;  Service: Urology;  Laterality: Right;   VASECTOMY      Medical History: Past Medical History:  Diagnosis Date    Hyperlipidemia    Hypertension     Family History: Family History  Problem Relation Age of Onset   Hyperlipidemia Mother    Heart disease Father     Social History   Socioeconomic History   Marital status: Widowed    Spouse name: Not on file   Number of children: Not on file   Years of education: Not on file   Highest education level: Not on file  Occupational History   Not on file  Tobacco Use   Smoking status: Former    Types: Cigarettes   Smokeless tobacco: Never  Vaping Use   Vaping status: Never Used  Substance and Sexual Activity   Alcohol use: No   Drug use: No   Sexual activity: Not on file  Other Topics Concern   Not on file  Social History Narrative   Not on file   Social Drivers of Health   Tobacco Use: Medium Risk (03/03/2024)   Patient History    Smoking Tobacco Use: Former    Smokeless Tobacco Use: Never    Passive Exposure: Not on Actuary Strain: Not on file  Food Insecurity: Not on file  Transportation Needs: Not on file  Physical Activity: Not on file  Stress: Not on file  Social Connections: Not on file  Intimate Partner Violence: Not on file  Depression (PHQ2-9): Low Risk (02/04/2024)   Depression (PHQ2-9)    PHQ-2 Score: 0  Alcohol Screen: Not on file  Housing: Not on file  Utilities: Not on file  Health Literacy: Not on file      Review of Systems  Constitutional:  Negative for chills, fatigue and unexpected weight change.  HENT:  Negative for congestion, postnasal drip, rhinorrhea, sneezing and sore throat.   Eyes:  Negative for redness.  Respiratory:  Negative for cough, chest tightness and shortness of breath.   Cardiovascular:  Negative for chest pain and palpitations.  Gastrointestinal:  Negative for abdominal pain, constipation, diarrhea, nausea and vomiting.  Genitourinary:  Negative for dysuria and frequency.  Musculoskeletal:  Negative for arthralgias, back pain, joint swelling and neck pain.  Skin:   Negative for rash.       Feels bump along center chest along sternum with deep pressure  Neurological: Negative.  Negative for tremors and numbness.  Hematological:  Negative for adenopathy. Does not bruise/bleed easily.  Psychiatric/Behavioral:  Negative for behavioral problems (Depression), sleep disturbance and suicidal ideas. The patient is not nervous/anxious.     Vital Signs: BP 110/78   Pulse 68   Temp 98 F (36.7 C)   Resp 16   Ht 5' 11 (1.803 m)   Wt 160 lb 6.4 oz (72.8 kg)   SpO2 95%   BMI 22.37 kg/m    Physical Exam Vitals and nursing note reviewed.  Constitutional:      General: He is not in acute distress.    Appearance: He is well-developed and normal weight. He is not diaphoretic.  HENT:     Head: Normocephalic and atraumatic.     Right Ear: Ear canal normal.     Left Ear: Ear canal normal.     Mouth/Throat:     Pharynx: No oropharyngeal exudate.  Eyes:     Extraocular Movements: Extraocular movements intact.  Neck:     Thyroid : No thyromegaly.     Vascular: No JVD.     Trachea: No tracheal deviation.  Cardiovascular:     Rate and Rhythm: Normal rate and regular rhythm.     Heart sounds: Normal heart sounds. No murmur heard.    No friction rub. No gallop.  Pulmonary:     Effort: Pulmonary effort is normal. No respiratory distress.     Breath sounds: No wheezing or rales.  Musculoskeletal:        General: No tenderness. Normal range of motion.     Cervical back: Neck supple.  Skin:    General: Skin is warm and dry.  Neurological:     Mental Status: He is alert and oriented to person, place, and time.  Psychiatric:        Behavior: Behavior normal.        Thought Content: Thought content normal.        Judgment: Judgment normal.        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 Primary hypertension Well controlled, continue current  medication   General Counseling: makhari dovidio understanding of the findings of todays visit and agrees with plan of treatment. I have discussed any further diagnostic evaluation that may be needed or ordered today. We also reviewed his medications today. he has been encouraged to call the office with any questions or concerns that should arise related to todays visit.    No orders of the defined types were placed in this encounter.   No orders of the defined types were placed in this encounter.   This patient  was seen by Tinnie Pro, PA-C in collaboration with Dr. Sigrid Bathe as a part of collaborative care agreement.   Total time spent:25 Minutes Time spent includes review of chart, medications, test results, and follow up plan with the patient.      Dr Fozia M Khan Internal medicine

## 2024-03-24 ENCOUNTER — Ambulatory Visit: Admitting: Internal Medicine

## 2024-04-01 ENCOUNTER — Ambulatory Visit (INDEPENDENT_AMBULATORY_CARE_PROVIDER_SITE_OTHER): Admitting: Internal Medicine

## 2024-04-01 ENCOUNTER — Encounter: Payer: Self-pay | Admitting: Internal Medicine

## 2024-04-01 VITALS — BP 145/71 | HR 54 | Temp 98.0°F | Resp 16 | Ht 71.0 in | Wt 161.0 lb

## 2024-04-01 DIAGNOSIS — Z7189 Other specified counseling: Secondary | ICD-10-CM

## 2024-04-01 DIAGNOSIS — G4733 Obstructive sleep apnea (adult) (pediatric): Secondary | ICD-10-CM

## 2024-04-01 NOTE — Progress Notes (Unsigned)
 [facility] [address] [place], [place] [address]  Pulmonary Sleep Medicine   Office Visit Note  Patient Name: Michael Everett DOB: January 10, 1946 MRN [id number]  Date of Service: 04/01/2024  Complaints/HPI: He has been doing ok overall. He has lost weight since the last visit. Patient sleeps about 9h nightly and on his CPAP he has excellent compliance with 100% usage. He has no complications related to the device. Supplies are up to date  Office Spirometry Results:     ROS  General: (-) fever, (-) chills, (-) night sweats, (-) weakness Skin: (-) rashes, (-) itching,. Eyes: (-) visual changes, (-) redness, (-) itching. Nose and Sinuses: (-) nasal stuffiness or itchiness, (-) postnasal drip, (-) nosebleeds, (-) sinus trouble. Mouth and Throat: (-) sore throat, (-) hoarseness. Neck: (-) swollen glands, (-) enlarged thyroid , (-) neck pain. Respiratory: - cough, (-) bloody sputum, - shortness of breath, - wheezing. Cardiovascular: - ankle swelling, (-) chest pain. Lymphatic: (-) lymph node enlargement. Neurologic: (-) numbness, (-) tingling. Psychiatric: (-) anxiety, (-) depression   Current Medication: Outpatient Encounter Medications as of 04/01/2024  Medication Sig   aspirin EC 81 MG tablet Take 81 mg by mouth daily.   atorvastatin  (LIPITOR) 10 MG tablet TAKE 1 TABLET (10 MG TOTAL) BY MOUTH DAILY AT 6 PM.   finasteride  (PROSCAR ) 5 MG tablet TAKE 1 TABLET BY MOUTH EVERY DAY   fluticasone  (FLONASE ) 50 MCG/ACT nasal spray Place 1 spray into both nostrils daily.   lisinopril  (ZESTRIL ) 40 MG tablet TAKE 1 TABLET BY MOUTH EVERY DAY FOR BLOOD PRESSURE CONTROL   tamsulosin  (FLOMAX ) 0.4 MG CAPS capsule TAKE 1 CAPSULE BY MOUTH EVERY DAY   No facility-administered encounter medications on file as of 04/01/2024.    Surgical History: Past Surgical History:  Procedure Laterality Date   cataract and lens implant right eye  2015   CATARACT EXTRACTION, BILATERAL Bilateral  10/2013   EXTRACORPOREAL SHOCK WAVE LITHOTRIPSY Right 10/23/2019   Procedure: EXTRACORPOREAL SHOCK WAVE LITHOTRIPSY (ESWL);  Surgeon: Twylla Glendia BROCKS, MD;  Location: [facility] ORS;  Service: Urology;  Laterality: Right;   VASECTOMY      Medical History: Past Medical History:  Diagnosis Date   Hyperlipidemia    Hypertension     Family History: Family History  Problem Relation Age of Onset   Hyperlipidemia Mother    Heart disease Father     Social History: Social History   Socioeconomic History   Marital status: Widowed    Spouse name: Not on file   Number of children: Not on file   Years of education: Not on file   Highest education level: Not on file  Occupational History   Not on file  Tobacco Use   Smoking status: Former    Types: Cigarettes   Smokeless tobacco: Never  Vaping Use   Vaping status: Never Used  Substance and Sexual Activity   Alcohol use: No   Drug use: No   Sexual activity: Not on file  Other Topics Concern   Not on file  Social History Narrative   Not on file   Social Drivers of Health   Tobacco Use: Medium Risk (04/01/2024)   Patient History    Smoking Tobacco Use: Former    Smokeless Tobacco Use: Never    Passive Exposure: Not on Actuary Strain: Not on file  Food Insecurity: Not on file  Transportation Needs: Not on file  Physical Activity: Not on file  Stress: Not on file  Social Connections: Not on file  Intimate Partner Violence: Not on file  Depression [id number]): Low Risk (02/04/2024)   Depression (PHQ2-9)    PHQ-2 Score: 0  Alcohol Screen: Not on file  Housing: Not on file  Utilities: Not on file  Health Literacy: Not on file    Vital Signs: Blood pressure (!) 145/71, pulse (!) 54, temperature 98 F (36.7 C), resp. rate 16, height 5' 11 (1.803 m), weight 161 lb (73 kg), SpO2 100%.  Examination: General Appearance: The patient is well-developed, well-nourished, and in no distress. Skin: Gross inspection of  skin unremarkable. Head: normocephalic, no gross deformities. Eyes: no gross deformities noted. ENT: ears appear grossly normal no exudates. Neck: Supple. No thyromegaly. No LAD. Respiratory: no rhonch noted. Cardiovascular: Normal S1 and S2 without murmur or rub. Extremities: No cyanosis. pulses are equal. Neurologic: Alert and oriented. No involuntary movements.  LABS: No results found for this or any previous visit (from the past 2160 hours).  Radiology: US  CHEST SOFT TISSUE Result Date: 02/24/2024 CLINICAL DATA:  Protrusion along left base of sternum, tenderness to palpation EXAM: ULTRASOUND OF CHEST SOFT TISSUES TECHNIQUE: Ultrasound examination of the chest wall soft tissues was performed in the area of clinical concern. COMPARISON:  10/21/2019 FINDINGS: Sonographic evaluation of the palpable area on the left inferior margin of the sternum was performed. 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 No other focal sonographic abnormalities. IMPRESSION: 1. 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 Electronically Signed   By: Ozell Daring M.D.   On: 02/24/2024 20:29    No results found.  No results found.  Assessment and Plan: Patient Active Problem List   Diagnosis Date Noted   Encounter for general adult medical examination with abnormal findings 09/29/2019   Bradycardia 03/29/2019   OSA on CPAP 03/29/2019   Diastolic dysfunction 11/17/2018   Snoring 11/17/2018   Special screening for malignant neoplasm of prostate 09/17/2017   Need for vaccination against Streptococcus pneumoniae using pneumococcal conjugate vaccine 13 09/17/2017   Dysuria 09/17/2017   Hypertension 04/29/2017   Hyperlipidemia  04/29/2017   Cervicalgia 04/29/2017    1. OSA on CPAP (Primary) ***  2. CPAP use counseling ***   General Counseling: I have discussed the findings of the evaluation and examination with Jackee.  I have also discussed any further diagnostic evaluation thatmay be needed or ordered today. Rueben verbalizes understanding of the findings of todays visit. We also reviewed his medications today and discussed drug interactions and side effects including but not limited excessive drowsiness and altered mental states. We also discussed that there is always a risk not just to him but also people around him. he has been encouraged to call the office with any questions or concerns that should arise related to todays visit.  No orders of the defined types were placed in this encounter.    Time spent: 28  I have personally obtained a history, examined the patient, evaluated laboratory and imaging results, formulated the assessment and plan and placed orders.    Elfreda DELENA Bathe, MD [facility] Pulmonary and Critical Care Sleep medicine

## 2024-04-01 NOTE — Patient Instructions (Signed)

## 2024-04-24 NOTE — Progress Notes (Unsigned)
 "   04/25/2024 2:50 PM   Michael Everett 1945/09/07 [id number]  Referring provider: Kristina Tinnie POUR, PA-C [address] [place],  [place] [address]  Urological history 1.  Nephrolithiasis - ESWL on 10/23/2019 for 7 mm right proximal ureteral calculus  - RUS 12/2019 no hydro - stone composition 70% calcium  oxalate monohydrate and 30% calcium  oxalate dihydrate - KUB (03/2022) no stones seen  2. BPH with LU TS - PSA (03/2021) 0.8 - finasteride  5 mg daily and tamsulosin  0.4 mg daily  HPI: Michael Everett is a 79 y.o. who presents today for a one year follow up.    Previous records reviewed.     I PSS 8/2  - Irritative symptoms: frequency, urgency, nocturia  ___*** - Obstructive symptoms: weak stream, hesitancy, intermittency, straining, incomplete emptying *** - Incontinence: none/post-void dribbling/urge incontinence/stress incontinence/mixed incontinence, and # of pads *** - Symptom progression: stable / worsening *** - Current therapy: ?-blocker? 5-ARI? (document) *** - Response to therapy: partial / good / inadequate *** - Denies: gross hematuria, dysuria, flank pain, fever, chills, retention. *** - Quality of life: moderate bother from urinary symptoms ***  Serum creatinine (09/2023) 0.99  PMH: Past Medical History:  Diagnosis Date   Hyperlipidemia    Hypertension     Surgical History: Past Surgical History:  Procedure Laterality Date   cataract and lens implant right eye  2015   CATARACT EXTRACTION, BILATERAL Bilateral 10/2013   EXTRACORPOREAL SHOCK WAVE LITHOTRIPSY Right 10/23/2019   Procedure: EXTRACORPOREAL SHOCK WAVE LITHOTRIPSY (ESWL);  Surgeon: Twylla Glendia BROCKS, MD;  Location: [facility] ORS;  Service: Urology;  Laterality: Right;   VASECTOMY      Home Medications:  Current Outpatient Medications on File Prior to Visit  Medication Sig Dispense Refill   aspirin EC 81 MG tablet Take 81 mg by mouth daily.     atorvastatin  (LIPITOR) 10 MG tablet TAKE 1  TABLET (10 MG TOTAL) BY MOUTH DAILY AT 6 PM. 90 tablet 2   finasteride  (PROSCAR ) 5 MG tablet TAKE 1 TABLET BY MOUTH EVERY DAY 90 tablet 1   fluticasone  (FLONASE ) 50 MCG/ACT nasal spray Place 1 spray into both nostrils daily. 48 mL 3   lisinopril  (ZESTRIL ) 40 MG tablet TAKE 1 TABLET BY MOUTH EVERY DAY FOR BLOOD PRESSURE CONTROL 90 tablet 1   tamsulosin  (FLOMAX ) 0.4 MG CAPS capsule TAKE 1 CAPSULE BY MOUTH EVERY DAY 90 capsule 1   No current facility-administered medications on file prior to visit.    Allergies: No Known Allergies  Family History: Family History  Problem Relation Age of Onset   Hyperlipidemia Mother    Heart disease Father     Social History:  reports that he has quit smoking. His smoking use included cigarettes. He has never used smokeless tobacco. He reports that he does not drink alcohol and does not use drugs.  ROS: Pertinent ROS in HPI  Physical Exam: There were no vitals taken for this visit.  Constitutional:  Well nourished. Alert and oriented, No acute distress. HEENT: [place] AT, moist mucus membranes.  Trachea midline, no masses. Cardiovascular: No clubbing, cyanosis, or edema. Respiratory: Normal respiratory effort, no increased work of breathing. GI: Abdomen is soft, non tender, non distended, no abdominal masses. Liver and spleen not palpable.  No hernias appreciated.  Stool sample for occult testing is not indicated.   GU: No CVA tenderness.  No bladder fullness or masses.  Patient with circumcised/uncircumcised phallus. ***Foreskin easily retracted***  Urethral meatus is patent.  No  penile discharge. No penile lesions or rashes. Scrotum without lesions, cysts, rashes and/or edema.  Testicles are located scrotally bilaterally. No masses are appreciated in the testicles. Left and right epididymis are normal. Rectal: Patient with  normal sphincter tone. Anus and perineum without scarring or rashes. No rectal masses are appreciated. Prostate is approximately ***  grams, *** nodules are appreciated. Seminal vesicles are normal. Skin: No rashes, bruises or suspicious lesions. Lymph: No cervical or inguinal adenopathy. Neurologic: Grossly intact, no focal deficits, moving all 4 extremities. Psychiatric: Normal mood and affect.    Laboratory Data: See Epic and HPI   I have reviewed the labs.   Pertinent Imaging: N/A  Assessment & Plan:    1. BPH with LU TS  - moderate symptoms; no red flags; PVR acceptable. *** - continue / adjust / initiate tadalafil 5 mg daily *** - Continue / adjust / initiate ?-blocker for symptom relief. *** - Consider 5-ARI if prostate >40 g or PSA >1.5.*** - Discussed combination therapy if symptoms remain moderate-severe.*** - Behavioral strategies: reduce evening fluids, avoid bladder irritants, timed/double voiding.*** - Cannot tolerate medication or medication failure, schedule cystoscopy *** - educated on red flag symptoms: acute retention, gross hematuria, fever, severe pain - advised to call clinic or go to the ED if these occur - return to clinic in *** symptom re-evaluation ***   2. Nephrolithiasis - No stones on KUB one year ago    3.  Postvoid dribbling - ***  No follow-ups on file.  These notes generated with voice recognition software. I apologize for typographical errors.  CLOTILDA HELON RIGGERS  [facility] [address]  [address] [place], [place] [address] [phone] "

## 2024-04-25 ENCOUNTER — Ambulatory Visit: Payer: Medicare HMO | Admitting: Urology

## 2024-04-25 ENCOUNTER — Ambulatory Visit: Admission: RE | Admit: 2024-04-25 | Source: Home / Self Care

## 2024-04-25 ENCOUNTER — Ambulatory Visit: Admission: RE | Admit: 2024-04-25 | Source: Ambulatory Visit

## 2024-04-25 ENCOUNTER — Other Ambulatory Visit: Payer: Self-pay | Admitting: Urology

## 2024-04-25 VITALS — BP 146/89 | HR 69 | Wt 160.0 lb

## 2024-04-25 DIAGNOSIS — N200 Calculus of kidney: Secondary | ICD-10-CM

## 2024-04-25 DIAGNOSIS — N3943 Post-void dribbling: Secondary | ICD-10-CM

## 2024-04-25 DIAGNOSIS — N138 Other obstructive and reflux uropathy: Secondary | ICD-10-CM

## 2024-11-03 ENCOUNTER — Ambulatory Visit: Admitting: Physician Assistant

## 2025-04-06 ENCOUNTER — Ambulatory Visit: Admitting: Internal Medicine

## 2025-04-27 ENCOUNTER — Ambulatory Visit: Admitting: Urology
